# Patient Record
Sex: Female | Born: 1975
Health system: Southern US, Community
[De-identification: ages and names within clinical notes are randomized; demographics above are authoritative.]

## PROBLEM LIST (undated history)

## (undated) DIAGNOSIS — N870 Mild cervical dysplasia: Secondary | ICD-10-CM

## (undated) DIAGNOSIS — K7689 Other specified diseases of liver: Secondary | ICD-10-CM

## (undated) DIAGNOSIS — N760 Acute vaginitis: Secondary | ICD-10-CM

## (undated) DIAGNOSIS — B9689 Other specified bacterial agents as the cause of diseases classified elsewhere: Secondary | ICD-10-CM

## (undated) DIAGNOSIS — C801 Malignant (primary) neoplasm, unspecified: Secondary | ICD-10-CM

## (undated) HISTORY — DX: Mild cervical dysplasia: N87.0

## (undated) HISTORY — DX: Other specified bacterial agents as the cause of diseases classified elsewhere: B96.89

## (undated) HISTORY — DX: Acute vaginitis: N76.0

## (undated) HISTORY — DX: Other specified diseases of liver: K76.89

## (undated) HISTORY — PX: WISDOM TOOTH EXTRACTION: SHX21

## (undated) HISTORY — DX: Other specified bacterial agents as the cause of diseases classified elsewhere: N76.0

---

## 2000-12-28 ENCOUNTER — Other Ambulatory Visit: Admission: RE | Admit: 2000-12-28 | Discharge: 2000-12-28 | Payer: Self-pay | Admitting: Obstetrics and Gynecology

## 2002-01-06 ENCOUNTER — Other Ambulatory Visit: Admission: RE | Admit: 2002-01-06 | Discharge: 2002-01-06 | Payer: Self-pay | Admitting: Obstetrics and Gynecology

## 2002-03-27 ENCOUNTER — Emergency Department (HOSPITAL_COMMUNITY): Admission: EM | Admit: 2002-03-27 | Discharge: 2002-03-27 | Payer: Self-pay | Admitting: Emergency Medicine

## 2002-12-07 ENCOUNTER — Other Ambulatory Visit: Admission: RE | Admit: 2002-12-07 | Discharge: 2002-12-07 | Payer: Self-pay | Admitting: Obstetrics and Gynecology

## 2004-01-08 ENCOUNTER — Other Ambulatory Visit: Admission: RE | Admit: 2004-01-08 | Discharge: 2004-01-08 | Payer: Self-pay | Admitting: Obstetrics and Gynecology

## 2005-01-28 ENCOUNTER — Other Ambulatory Visit: Admission: RE | Admit: 2005-01-28 | Discharge: 2005-01-28 | Payer: Self-pay | Admitting: Obstetrics and Gynecology

## 2005-07-14 HISTORY — PX: LIVER BIOPSY: SHX301

## 2006-01-29 ENCOUNTER — Other Ambulatory Visit: Admission: RE | Admit: 2006-01-29 | Discharge: 2006-01-29 | Payer: Self-pay | Admitting: Obstetrics and Gynecology

## 2008-07-14 DIAGNOSIS — K7689 Other specified diseases of liver: Secondary | ICD-10-CM

## 2008-07-14 HISTORY — DX: Other specified diseases of liver: K76.89

## 2010-01-10 ENCOUNTER — Encounter: Admission: RE | Admit: 2010-01-10 | Discharge: 2010-01-10 | Payer: Self-pay | Admitting: Family Medicine

## 2010-01-15 ENCOUNTER — Encounter: Admission: RE | Admit: 2010-01-15 | Discharge: 2010-01-15 | Payer: Self-pay | Admitting: Gastroenterology

## 2010-01-22 ENCOUNTER — Ambulatory Visit (HOSPITAL_COMMUNITY): Admission: RE | Admit: 2010-01-22 | Discharge: 2010-01-22 | Payer: Self-pay | Admitting: Gastroenterology

## 2010-01-28 ENCOUNTER — Encounter: Admission: RE | Admit: 2010-01-28 | Discharge: 2010-01-28 | Payer: Self-pay | Admitting: Gastroenterology

## 2010-02-05 ENCOUNTER — Ambulatory Visit (HOSPITAL_COMMUNITY): Admission: RE | Admit: 2010-02-05 | Discharge: 2010-02-05 | Payer: Self-pay | Admitting: Gastroenterology

## 2010-04-16 HISTORY — PX: LEEP: SHX91

## 2010-09-28 LAB — SAMPLE TO BLOOD BANK

## 2010-09-29 LAB — CBC
HCT: 39.1 % (ref 36.0–46.0)
Hemoglobin: 13.7 g/dL (ref 12.0–15.0)
MCH: 32.3 pg (ref 26.0–34.0)

## 2010-09-29 LAB — APTT: aPTT: 26 seconds (ref 24–37)

## 2010-09-29 LAB — PROTIME-INR: Prothrombin Time: 11.5 seconds — ABNORMAL LOW (ref 11.6–15.2)

## 2012-04-12 ENCOUNTER — Encounter: Payer: Self-pay | Admitting: Obstetrics and Gynecology

## 2012-04-20 ENCOUNTER — Encounter: Payer: Self-pay | Admitting: Obstetrics and Gynecology

## 2012-04-20 ENCOUNTER — Ambulatory Visit (INDEPENDENT_AMBULATORY_CARE_PROVIDER_SITE_OTHER): Payer: 59 | Admitting: Obstetrics and Gynecology

## 2012-04-20 VITALS — BP 110/60 | Ht 63.0 in | Wt 123.0 lb

## 2012-04-20 DIAGNOSIS — A630 Anogenital (venereal) warts: Secondary | ICD-10-CM

## 2012-04-20 DIAGNOSIS — Z139 Encounter for screening, unspecified: Secondary | ICD-10-CM

## 2012-04-20 NOTE — Progress Notes (Signed)
Here for STD testing and to ask questions about herpes sec to ex BF recently dx'd  Filed Vitals:   04/20/12 1445  BP: 110/60   ROS: noncontributory  Pelvic exam:  VULVA: normal appearing vulva with no masses, tenderness or lesions,  VAGINA: normal appearing vagina with normal color and discharge, no lesions, left labia with warts about 1cm looks like coalescence of 3 CERVIX: normal appearing cervix without discharge or lesions,  UTERUS: uterus is normal size, shape, consistency and nontender,  ADNEXA: normal adnexa in size, nontender and no masses.  A/P STD w/u with consent Questions answered PT wants warts removed, will do at visit next wk and likely r/s AEX

## 2012-04-21 LAB — RPR

## 2012-04-21 LAB — GC/CHLAMYDIA PROBE AMP, GENITAL: GC Probe Amp, Genital: NEGATIVE

## 2012-04-21 LAB — HSV 2 ANTIBODY, IGG: HSV 2 Glycoprotein G Ab, IgG: <0.1 IV

## 2012-04-21 LAB — HSV 1 ANTIBODY, IGG: HSV 1 Glycoprotein G Ab, IgG: 1.83 IV — ABNORMAL HIGH

## 2012-04-29 ENCOUNTER — Ambulatory Visit: Payer: 59

## 2012-04-29 ENCOUNTER — Encounter: Payer: Self-pay | Admitting: Obstetrics and Gynecology

## 2012-04-29 ENCOUNTER — Ambulatory Visit (INDEPENDENT_AMBULATORY_CARE_PROVIDER_SITE_OTHER): Payer: 59 | Admitting: Obstetrics and Gynecology

## 2012-04-29 VITALS — BP 100/64 | Ht 63.0 in | Wt 125.0 lb

## 2012-04-29 DIAGNOSIS — B079 Viral wart, unspecified: Secondary | ICD-10-CM

## 2012-04-29 DIAGNOSIS — Z124 Encounter for screening for malignant neoplasm of cervix: Secondary | ICD-10-CM

## 2012-04-29 NOTE — Progress Notes (Addendum)
Contraception None Last pap 10/30/2010 WNL Last Mammo None Last Colonoscopy None Last Dexa Scan None Primary MD [facility] Abuse at Home None  Pt here for wart removal Filed Vitals:   04/29/12 0943  BP: 100/64   Left labial warts identified adjacent to clitoris Area injected with approx 5cc on 1% lidocaine Lesion excised after prep with betadine, about 1.5cm  A/P Specimen sent to path - h/o VIN 2, neg deep margin, small area pos on lat margin Next available for AEX Reviewed labs from last visit

## 2012-05-03 ENCOUNTER — Telehealth: Payer: Self-pay

## 2012-05-03 NOTE — Telephone Encounter (Signed)
Pt was called and given STD results done on 04/20/2012,+ HSV 1. Mathis Bud

## 2012-05-04 LAB — PATHOLOGY

## 2012-06-08 ENCOUNTER — Encounter: Payer: Self-pay | Admitting: Obstetrics and Gynecology

## 2012-06-08 ENCOUNTER — Ambulatory Visit (INDEPENDENT_AMBULATORY_CARE_PROVIDER_SITE_OTHER): Payer: 59 | Admitting: Obstetrics and Gynecology

## 2012-06-08 DIAGNOSIS — Z124 Encounter for screening for malignant neoplasm of cervix: Secondary | ICD-10-CM

## 2012-06-08 NOTE — Progress Notes (Signed)
  Pt left without being seen this day. She will r/s. Melody Comas A

## 2012-08-23 DIAGNOSIS — B9689 Other specified bacterial agents as the cause of diseases classified elsewhere: Secondary | ICD-10-CM | POA: Insufficient documentation

## 2012-08-23 DIAGNOSIS — N870 Mild cervical dysplasia: Secondary | ICD-10-CM | POA: Insufficient documentation

## 2012-08-25 ENCOUNTER — Encounter: Payer: Self-pay | Admitting: Obstetrics and Gynecology

## 2012-08-25 ENCOUNTER — Ambulatory Visit: Payer: BC Managed Care – PPO | Admitting: Obstetrics and Gynecology

## 2012-08-25 VITALS — BP 102/68 | Resp 18 | Ht 63.0 in | Wt 119.0 lb

## 2012-08-25 DIAGNOSIS — Z124 Encounter for screening for malignant neoplasm of cervix: Secondary | ICD-10-CM

## 2012-08-25 DIAGNOSIS — N901 Moderate vulvar dysplasia: Secondary | ICD-10-CM

## 2012-08-25 MED ORDER — IMIQUIMOD 5 % EX CREA: TOPICAL_CREAM | CUTANEOUS | Status: DC

## 2012-08-25 NOTE — Progress Notes (Signed)
Patient ID: Katrina Lewis, female   DOB: 09/28/1975, 37 y.o.   MRN: [id number] Contraception none Last pap 10/2010 wnl Last Mammo never Last Colonoscopy never Last Dexa Scan 2 yrs ago/ here/ Osteopenia Primary [facility] Abuse at Home none  No complaints except a lot of pain healing the last time she had lesion removed.  Filed Vitals:   08/25/12 1025  BP: 102/68  Resp: 18   ROS: noncontributory  Physical Examination: General appearance - alert, well appearing, and in no distress Neck - supple, no significant adenopathy Chest - clear to auscultation, no wheezes, rales or rhonchi, symmetric air entry Heart - normal rate and regular rhythm Abdomen - soft, nontender, nondistended, no masses or organomegaly Breasts - breasts appear normal, no suspicious masses, no skin or nipple changes or axillary nodes Pelvic - normal external genitalia, vulva, vagina, cervix, uterus and adnexa, left labia with recurring lesion at area of prior resection, nt Back exam - no CVAT Extremities - no edema, redness or tenderness in the calves or thighs  Path - Skin, excision, left labia:  High grade squamous intraepithelial lesion / Vulvar intraepithelial neoplasia Grade 2 to 3 extending to lateral margin  A/P Path reviewed  - I rec further resection Pt wants to sched in Spring and in the meantime do aldara - she will call to schedule - sched for in proc room no overbooking Pap today Rx aldara

## 2012-08-26 LAB — PAP IG W/ RFLX HPV ASCU

## 2012-08-27 NOTE — Progress Notes (Signed)
Recall entered in Epic for pt to call and schedule minor procedure in June. I left Asher Muir  A message as wll, so she can make sure recall gets transferred from Epic to [place].Marga Hoots, Gerre Scull

## 2013-01-13 ENCOUNTER — Other Ambulatory Visit: Payer: Self-pay | Admitting: Obstetrics and Gynecology

## 2013-02-16 ENCOUNTER — Other Ambulatory Visit: Payer: Self-pay | Admitting: Obstetrics and Gynecology

## 2013-02-16 NOTE — H&P (Signed)
  Katrina Lewis is a 37 y.o.  female,  G0 presents for wide excision of left labia because of VIN-III/CIS.  Patient has a long history of vulvar dysplasia with a diagnosis of VIN-II in 2011 and in October 2013 had a lesion removed from the same area that returned VIN II-III.  At her follow up in February 2014, she was found to have a recurrence of her left labial lesion and was advised to have it removed.  The patient reported severe pain during the healing of her previous excision so she chose to delay another procedure.  During the interim she was given Aldara to use on her lesion.  On January 13, 2013 the patient underwent another excision of her left vulvar  and pathology report showed  High Grade VIN/VIN III/CIS.  Given these findings and the recurrent nature of her vulvar lesion,  the patient has consented to  a wide excision of her left labia.   Past Medical History  OB History: G0P0   GYN History: menarche: 37 YO;  LMP: 01/24/2013  Contracepton no method  The patient reports a past history of: HPV.   Last PAP smear: February 2014-normal  Medical History: Attention Deficit Disorder,  Autoimmune Hepatitis, osteopenis  Surgical History:  Loop Electrosurgical Excision Procedure Denies problems with anesthesia or history of blood transfusions  Family History: Diabetes mellitus, hypertension and elevated cholesterol  Social History: Married and employed in [employer];  Admits to daily cigarette smoking, occasional alcohol but denies illicit drug use.   Medication: Azothioprine 50 mg daily Amphetamine Salt Combo 10 mg daily  No Known Allergies  Denies sensitivity to peanuts, shellfish, soy, latex or adhesives.   ROS: Admits to glasses but  denies headache, vision changes, nasal congestion, dysphagia, tinnitus, dizziness, hoarseness, cough,  chest pain, shortness of breath, nausea, vomiting, diarrhea,constipation,  urinary frequency, urgency  dysuria, hematuria, vaginitis symptoms, pelvic pain,  swelling of joints,easy bruising,  myalgias, arthralgias, skin rashes, unexplained weight loss and except as is mentioned in the history of present illness, patient's review of systems is otherwise negative.    Physical Exam  Bp:   110/50  P: 65   R: 14  Temp. 99 degrees F orally   Weight: 121 lbs.  Height: 5'3"  BMI: 21.4  Neck: supple without masses or thyromegaly Lungs: clear to auscultation Heart: regular rate and rhythm Abdomen: soft, non-tender and no organomegaly Pelvic:EGBUS- wnl except left labia with 2 mm erosion on slight raised base, no drainage or tenderness,  vagina-normal rugae; uterus-normal size, cervix without lesions or motion tenderness; adnexae-no tenderness or masses Extremities:  no clubbing, cyanosis or edema   Assesment:  Vulvar Intraepithelial Neoplasia III/Carcinoma in Situ    Disposition:  A discussion was held with patient regarding the indication for her procedure(s) along with the risks, which include but are not limited to: reaction to anesthesia, damage to adjacent organs, infection and  excessive bleeding.  The patient verbalized understanding of these risks and has consented to proceed with Wide Excision of Left Labia at [facility] on February 28, 2013 at 9:30 a.m.   CSN# [id number]   Shyanna Klingel J. Lowell Guitar, PA-C  for Dr. Woodroe Mode.Su Hilt

## 2013-02-18 ENCOUNTER — Encounter (HOSPITAL_COMMUNITY): Payer: Self-pay | Admitting: *Deleted

## 2013-02-21 ENCOUNTER — Other Ambulatory Visit: Payer: Self-pay | Admitting: Obstetrics and Gynecology

## 2013-02-22 ENCOUNTER — Encounter (HOSPITAL_COMMUNITY): Payer: Self-pay | Admitting: Pharmacy Technician

## 2013-03-02 ENCOUNTER — Ambulatory Visit (HOSPITAL_COMMUNITY): Payer: BC Managed Care – PPO | Admitting: Anesthesiology

## 2013-03-02 ENCOUNTER — Encounter (HOSPITAL_COMMUNITY): Payer: Self-pay | Admitting: *Deleted

## 2013-03-02 ENCOUNTER — Ambulatory Visit (HOSPITAL_COMMUNITY)
Admission: RE | Admit: 2013-03-02 | Discharge: 2013-03-02 | Disposition: A | Discharge status: Home / Self Care | Payer: BC Managed Care – PPO | Source: Ambulatory Visit | Attending: Obstetrics and Gynecology | Admitting: Obstetrics and Gynecology

## 2013-03-02 ENCOUNTER — Encounter (HOSPITAL_COMMUNITY): Payer: Self-pay | Admitting: Anesthesiology

## 2013-03-02 ENCOUNTER — Encounter (HOSPITAL_COMMUNITY)
Admission: RE | Disposition: A | Discharge status: Home / Self Care | Payer: Self-pay | Source: Ambulatory Visit | Attending: Obstetrics and Gynecology

## 2013-03-02 DIAGNOSIS — D071 Carcinoma in situ of vulva: Secondary | ICD-10-CM | POA: Insufficient documentation

## 2013-03-02 HISTORY — PX: LABIOPLASTY: SHX1900

## 2013-03-02 LAB — CBC
HCT: 35.8 % — ABNORMAL LOW (ref 36.0–46.0)
Platelets: 375 10*3/uL (ref 150–400)
RBC: 3.26 MIL/uL — ABNORMAL LOW (ref 3.87–5.11)
RDW: 15.1 % (ref 11.5–15.5)
WBC: 6.5 10*3/uL (ref 4.0–10.5)

## 2013-03-02 LAB — HCG, SERUM, QUALITATIVE: Preg, Serum: NEGATIVE

## 2013-03-02 SURGERY — LABIAPLASTY, VULVA
Anesthesia: General | Site: Vagina | Laterality: Left | Wound class: Clean Contaminated

## 2013-03-02 MED ORDER — ONDANSETRON HCL 4 MG/2ML IJ SOLN: 4.0000 mg | Freq: Once | INTRAMUSCULAR | Status: DC | PRN

## 2013-03-02 MED ORDER — LACTATED RINGERS IV SOLN
INTRAVENOUS | Status: DC
  Administered 2013-03-02 (×3): via INTRAVENOUS

## 2013-03-02 MED ORDER — EPHEDRINE SULFATE 50 MG/ML IJ SOLN
INTRAMUSCULAR | Status: DC | PRN
  Administered 2013-03-02 (×4): 10 mg via INTRAVENOUS

## 2013-03-02 MED ORDER — MEPERIDINE HCL 25 MG/ML IJ SOLN: 6.2500 mg | INTRAMUSCULAR | Status: DC | PRN

## 2013-03-02 MED ORDER — MIDAZOLAM HCL 2 MG/2ML IJ SOLN
INTRAMUSCULAR | Status: AC
  Filled 2013-03-02: qty 2

## 2013-03-02 MED ORDER — LIDOCAINE HCL (CARDIAC) 20 MG/ML IV SOLN
INTRAVENOUS | Status: AC
  Filled 2013-03-02: qty 5

## 2013-03-02 MED ORDER — HYDROMORPHONE HCL 2 MG PO TABS
ORAL_TABLET | ORAL | Status: AC
  Administered 2013-03-02: 2 mg via ORAL
  Filled 2013-03-02: qty 1

## 2013-03-02 MED ORDER — IBUPROFEN 600 MG PO TABS: 600.0000 mg | ORAL_TABLET | Freq: Four times a day (QID) | ORAL | Status: DC | PRN

## 2013-03-02 MED ORDER — OXYTOCIN 10 UNIT/ML IJ SOLN
INTRAMUSCULAR | Status: AC
  Filled 2013-03-02: qty 1

## 2013-03-02 MED ORDER — LIDOCAINE HCL (CARDIAC) 20 MG/ML IV SOLN
INTRAVENOUS | Status: DC | PRN
  Administered 2013-03-02: 50 mg via INTRAVENOUS

## 2013-03-02 MED ORDER — KETOROLAC TROMETHAMINE 30 MG/ML IJ SOLN
INTRAMUSCULAR | Status: AC
  Filled 2013-03-02: qty 1

## 2013-03-02 MED ORDER — KETOROLAC TROMETHAMINE 30 MG/ML IJ SOLN: 15.0000 mg | Freq: Once | INTRAMUSCULAR | Status: DC | PRN

## 2013-03-02 MED ORDER — PROPOFOL 10 MG/ML IV BOLUS
INTRAVENOUS | Status: DC | PRN
  Administered 2013-03-02: 180 mg via INTRAVENOUS

## 2013-03-02 MED ORDER — ONDANSETRON HCL 4 MG/2ML IJ SOLN
INTRAMUSCULAR | Status: DC | PRN
  Administered 2013-03-02: 4 mg via INTRAVENOUS

## 2013-03-02 MED ORDER — FENTANYL CITRATE 0.05 MG/ML IJ SOLN: 25.0000 ug | INTRAMUSCULAR | Status: DC | PRN

## 2013-03-02 MED ORDER — BUPIVACAINE HCL (PF) 0.25 % IJ SOLN
INTRAMUSCULAR | Status: AC
  Filled 2013-03-02: qty 30

## 2013-03-02 MED ORDER — LIDOCAINE-EPINEPHRINE (PF) 1 %-1:200000 IJ SOLN
INTRAMUSCULAR | Status: AC
  Filled 2013-03-02: qty 10

## 2013-03-02 MED ORDER — KETOROLAC TROMETHAMINE 30 MG/ML IJ SOLN
INTRAMUSCULAR | Status: DC | PRN
  Administered 2013-03-02: 30 mg via INTRAVENOUS

## 2013-03-02 MED ORDER — MIDAZOLAM HCL 5 MG/5ML IJ SOLN
INTRAMUSCULAR | Status: DC | PRN
  Administered 2013-03-02: 2 mg via INTRAVENOUS

## 2013-03-02 MED ORDER — HYDROMORPHONE HCL 2 MG PO TABS: 2.0000 mg | ORAL_TABLET | Freq: Four times a day (QID) | ORAL | Status: DC | PRN

## 2013-03-02 MED ORDER — FENTANYL CITRATE 0.05 MG/ML IJ SOLN
INTRAMUSCULAR | Status: DC | PRN
  Administered 2013-03-02: 50 ug via INTRAVENOUS
  Administered 2013-03-02: 100 ug via INTRAVENOUS

## 2013-03-02 MED ORDER — FENTANYL CITRATE 0.05 MG/ML IJ SOLN
INTRAMUSCULAR | Status: AC
  Filled 2013-03-02: qty 5

## 2013-03-02 MED ORDER — DEXAMETHASONE SODIUM PHOSPHATE 10 MG/ML IJ SOLN
INTRAMUSCULAR | Status: AC
  Filled 2013-03-02: qty 1

## 2013-03-02 MED ORDER — LIDOCAINE-EPINEPHRINE (PF) 1 %-1:200000 IJ SOLN
INTRAMUSCULAR | Status: DC | PRN
  Administered 2013-03-02: 4 mL

## 2013-03-02 MED ORDER — PROPOFOL 10 MG/ML IV EMUL
INTRAVENOUS | Status: AC
  Filled 2013-03-02: qty 20

## 2013-03-02 MED ORDER — ONDANSETRON HCL 4 MG/2ML IJ SOLN
INTRAMUSCULAR | Status: AC
  Filled 2013-03-02: qty 2

## 2013-03-02 MED ORDER — DEXAMETHASONE SODIUM PHOSPHATE 10 MG/ML IJ SOLN
INTRAMUSCULAR | Status: DC | PRN
  Administered 2013-03-02: 10 mg via INTRAVENOUS

## 2013-03-02 MED ORDER — EPHEDRINE 5 MG/ML INJ
INTRAVENOUS | Status: AC
  Filled 2013-03-02: qty 10

## 2013-03-02 MED ORDER — BUPIVACAINE HCL 0.25 % IJ SOLN
INTRAMUSCULAR | Status: DC | PRN
  Administered 2013-03-02: 7 mL
  Administered 2013-03-02: 5 mL

## 2013-03-02 SURGICAL SUPPLY — 27 items
APL SKNCLS STERI-STRIP NONHPOA (GAUZE/BANDAGES/DRESSINGS) ×1
BENZOIN TINCTURE PRP APPL 2/3 (GAUZE/BANDAGES/DRESSINGS) ×1 IMPLANT
BLADE SURG 15 STRL LF C SS BP (BLADE) ×1 IMPLANT
BLADE SURG 15 STRL SS (BLADE) ×4
CLOTH BEACON ORANGE TIMEOUT ST (SAFETY) ×2 IMPLANT
COUNTER NEEDLE 1200 MAGNETIC (NEEDLE) IMPLANT
ELECT REM PT RETURN 9FT ADLT (ELECTROSURGICAL) ×2
ELECTRODE REM PT RTRN 9FT ADLT (ELECTROSURGICAL) ×1 IMPLANT
GLOVE BIO SURGEON STRL SZ7.5 (GLOVE) ×2 IMPLANT
GLOVE BIOGEL PI IND STRL 7.5 (GLOVE) ×2 IMPLANT
GLOVE BIOGEL PI INDICATOR 7.5 (GLOVE) ×2
GOWN STRL REIN XL XLG (GOWN DISPOSABLE) ×4 IMPLANT
PACK VAGINAL MINOR WOMEN LF (CUSTOM PROCEDURE TRAY) ×2 IMPLANT
PAD OB MATERNITY 4.3X12.25 (PERSONAL CARE ITEMS) ×2 IMPLANT
PENCIL BUTTON HOLSTER BLD 10FT (ELECTRODE) ×1 IMPLANT
STRIP CLOSURE SKIN 1/4X4 (GAUZE/BANDAGES/DRESSINGS) ×2 IMPLANT
SUT MON AB 4-0 PS1 27 (SUTURE) ×4 IMPLANT
SUT SILK 3 0 (SUTURE) ×2
SUT SILK 3-0 FS1 18XBRD (SUTURE) IMPLANT
SUT VIC AB 3-0 SH 27 (SUTURE) ×2
SUT VIC AB 3-0 SH 27XBRD (SUTURE) IMPLANT
SUT VIC AB 4-0 SH 27 (SUTURE) ×2
SUT VIC AB 4-0 SH 27XANBCTRL (SUTURE) IMPLANT
TOWEL OR 17X24 6PK STRL BLUE (TOWEL DISPOSABLE) ×4 IMPLANT
TUBING CONNECTING 10 (TUBING) ×1 IMPLANT
WATER STERILE IRR 1000ML POUR (IV SOLUTION) ×2 IMPLANT
YANKAUER SUCT BULB TIP NO VENT (SUCTIONS) ×2 IMPLANT

## 2013-03-02 NOTE — Op Note (Signed)
Preop Diagnosis: Carcinoma in situ of the Vulva   Postop Diagnosis: Carcinoma in situ of the Vulva   Procedure: Wide Local Excision of left labia  Anesthesia: General   Anesthesiologist: Brayton Caves, MD   Attending: Purcell Nails, MD   Assistant: N/a  Findings: N/a  Pathology: approx 2-3cm excised skin tagged with 1 black silk suture at 3:00 and 2 purple vicryl sutures at 9:00  Fluids: See Flowsheet  UOP: QS via straight cath prior to procedure  EBL: Minimal less than 50cc  Complications: None  Procedure: The patient was taken to the operating room after risks, benefits and alternatives were discussed with the patient and consent signed and witnessed.  A time out was performed per protocol.  The patient was prepped and draped in the normal sterile fashion in the dorsal lithotomy position.  The affected area was marked with a marking pen and the area excised after injecting 1% lidocaine with epi at a ratio of 1:200.  The underlying tissue was reapproximated in layers using 4-0 monocryl.  The skin was reapproximated using 3-0 vicryl and injected with 0.25% marcaine and 1% lidocaine with epi.  The specimen was tagged as mentioned above.  Steristrips were applied with benzoin.  Sponge, lap and needle counts were correct.  The patient tolerated the procedure well and was returned to the recovery room in good condition.

## 2013-03-02 NOTE — Interval H&P Note (Signed)
History and Physical Interval Note:  03/02/2013 9:58 AM  Katrina Lewis  has presented today for surgery, with the diagnosis of Carcinoma in situ of the Vulva  The various methods of treatment have been discussed with the patient and family. After consideration of risks, benefits and other options for treatment, the patient has consented to  Procedure(s) with comments: LABIAPLASTY (Left) - Wide Local Excision of Left Labia as a surgical intervention .  The patient's history has been reviewed, patient examined, no change in status, stable for surgery.  I have reviewed the patient's chart and labs.  Questions were answered to the patient's satisfaction.     Purcell Nails

## 2013-03-02 NOTE — Anesthesia Postprocedure Evaluation (Signed)
  Anesthesia Post Note  Patient: Katrina Lewis  Procedure(s) Performed: Procedure(s) (LRB): LABIAPLASTY (Left)  Anesthesia type: GA  Patient location: PACU  Post pain: Pain level controlled  Post assessment: Post-op Vital signs reviewed  Last Vitals:  Filed Vitals:   03/02/13 1215  BP: 98/54  Pulse: 58  Temp:   Resp: 13    Post vital signs: Reviewed  Level of consciousness: sedated  Complications: No apparent anesthesia complications

## 2013-03-02 NOTE — Transfer of Care (Signed)
Immediate Anesthesia Transfer of Care Note  Patient: Katrina Lewis  Procedure(s) Performed: Procedure(s) with comments: LABIAPLASTY (Left) - Wide Local Excision of Left Labia  Patient Location: PACU  Anesthesia Type:General  Level of Consciousness: awake  Airway & Oxygen Therapy: Patient Spontanous Breathing  Post-op Assessment: Report given to PACU RN  Post vital signs: stable  Filed Vitals:   03/02/13 0843  BP: 121/67  Pulse: 95  Temp: 36.7 C  Resp: 16    Complications: No apparent anesthesia complications

## 2013-03-02 NOTE — H&P (View-Only) (Signed)
  Katrina Lewis is a 37 y.o.  female,  G0 presents for wide excision of left labia because of VIN-III/CIS.  Patient has a long history of vulvar dysplasia with a diagnosis of VIN-II in 2011 and in October 2013 had a lesion removed from the same area that returned VIN II-III.  At her follow up in February 2014, she was found to have a recurrence of her left labial lesion and was advised to have it removed.  The patient reported severe pain during the healing of her previous excision so she chose to delay another procedure.  During the interim she was given Aldara to use on her lesion.  On January 13, 2013 the patient underwent another excision of her left vulvar  and pathology report showed  High Grade VIN/VIN III/CIS.  Given these findings and the recurrent nature of her vulvar lesion,  the patient has consented to  a wide excision of her left labia.   Past Medical History  OB History: G0P0   GYN History: menarche: 37 YO;  LMP: 01/24/2013  Contracepton no method  The patient reports a past history of: HPV.   Last PAP smear: February 2014-normal  Medical History: Attention Deficit Disorder,  Autoimmune Hepatitis, osteopenis  Surgical History:  Loop Electrosurgical Excision Procedure Denies problems with anesthesia or history of blood transfusions  Family History: Diabetes mellitus, hypertension and elevated cholesterol  Social History: Married and employed in sales;  Admits to daily cigarette smoking, occasional alcohol but denies illicit drug use.   Medication: Azothioprine 50 mg daily Amphetamine Salt Combo 10 mg daily  No Known Allergies  Denies sensitivity to peanuts, shellfish, soy, latex or adhesives.   ROS: Admits to glasses but  denies headache, vision changes, nasal congestion, dysphagia, tinnitus, dizziness, hoarseness, cough,  chest pain, shortness of breath, nausea, vomiting, diarrhea,constipation,  urinary frequency, urgency  dysuria, hematuria, vaginitis symptoms, pelvic pain,  swelling of joints,easy bruising,  myalgias, arthralgias, skin rashes, unexplained weight loss and except as is mentioned in the history of present illness, patient's review of systems is otherwise negative.    Physical Exam  Bp:   110/50  P: 65   R: 14  Temp. 99 degrees F orally   Weight: 121 lbs.  Height: 5'3"  BMI: 21.4  Neck: supple without masses or thyromegaly Lungs: clear to auscultation Heart: regular rate and rhythm Abdomen: soft, non-tender and no organomegaly Pelvic:EGBUS- wnl except left labia with 2 mm erosion on slight raised base, no drainage or tenderness,  vagina-normal rugae; uterus-normal size, cervix without lesions or motion tenderness; adnexae-no tenderness or masses Extremities:  no clubbing, cyanosis or edema   Assesment:  Vulvar Intraepithelial Neoplasia III/Carcinoma in Situ    Disposition:  A discussion was held with patient regarding the indication for her procedure(s) along with the risks, which include but are not limited to: reaction to anesthesia, damage to adjacent organs, infection and  excessive bleeding.  The patient verbalized understanding of these risks and has consented to proceed with Wide Excision of Left Labia at [facility] on February 28, 2013 at 9:30 a.m.   CSN# [id number]   Gottfried Standish J. Judd Mccubbin, PA-C  for Dr. Angela Y.Roberts   

## 2013-03-02 NOTE — Anesthesia Preprocedure Evaluation (Signed)
Anesthesia Evaluation  Patient identified by MRN, date of birth, ID band Patient awake    Reviewed: Allergy & Precautions, H&P , NPO status , Patient's Chart, lab work & pertinent test results  Airway Mallampati: I TM Distance: >3 FB Neck ROM: full    Dental no notable dental hx. (+) Teeth Intact   Pulmonary Current Smoker,    Pulmonary exam normal       Cardiovascular negative cardio ROS      Neuro/Psych negative neurological ROS  negative psych ROS   GI/Hepatic negative GI ROS, Neg liver ROS,   Endo/Other  negative endocrine ROS  Renal/GU negative Renal ROS  Female GU complaint     Musculoskeletal negative musculoskeletal ROS (+)   Abdominal Normal abdominal exam  (+)   Peds  Hematology negative hematology ROS (+)   Anesthesia Other Findings   Reproductive/Obstetrics negative OB ROS                           Anesthesia Physical Anesthesia Plan  ASA: II  Anesthesia Plan: General   Post-op Pain Management:    Induction: Intravenous  Airway Management Planned: LMA  Additional Equipment:   Intra-op Plan:   Post-operative Plan:   Informed Consent: I have reviewed the patients History and Physical, chart, labs and discussed the procedure including the risks, benefits and alternatives for the proposed anesthesia with the patient or authorized representative who has indicated his/her understanding and acceptance.     Plan Discussed with: CRNA and Surgeon  Anesthesia Plan Comments:         Anesthesia Quick Evaluation

## 2013-03-03 ENCOUNTER — Encounter (HOSPITAL_COMMUNITY): Payer: Self-pay | Admitting: Obstetrics and Gynecology

## 2019-08-18 ENCOUNTER — Emergency Department (HOSPITAL_COMMUNITY)
Admission: EM | Admit: 2019-08-18 | Discharge: 2019-08-18 | Disposition: A | Discharge status: Home / Self Care | Payer: 59 | Source: Home / Self Care | Attending: Emergency Medicine | Admitting: Emergency Medicine

## 2019-08-18 ENCOUNTER — Encounter (HOSPITAL_COMMUNITY): Payer: Self-pay

## 2019-08-18 ENCOUNTER — Emergency Department (HOSPITAL_COMMUNITY): Payer: 59

## 2019-08-18 ENCOUNTER — Ambulatory Visit (HOSPITAL_COMMUNITY): Payer: 59

## 2019-08-18 ENCOUNTER — Other Ambulatory Visit: Payer: Self-pay

## 2019-08-18 DIAGNOSIS — R1013 Epigastric pain: Secondary | ICD-10-CM | POA: Diagnosis not present

## 2019-08-18 DIAGNOSIS — R1011 Right upper quadrant pain: Secondary | ICD-10-CM | POA: Insufficient documentation

## 2019-08-18 DIAGNOSIS — Z79899 Other long term (current) drug therapy: Secondary | ICD-10-CM | POA: Insufficient documentation

## 2019-08-18 DIAGNOSIS — F17210 Nicotine dependence, cigarettes, uncomplicated: Secondary | ICD-10-CM | POA: Insufficient documentation

## 2019-08-18 DIAGNOSIS — N10 Acute tubulo-interstitial nephritis: Secondary | ICD-10-CM | POA: Diagnosis not present

## 2019-08-18 LAB — URINALYSIS, ROUTINE W REFLEX MICROSCOPIC
Bilirubin Urine: NEGATIVE
Glucose, UA: NEGATIVE mg/dL
Hgb urine dipstick: NEGATIVE
Ketones, ur: NEGATIVE mg/dL
Leukocytes,Ua: NEGATIVE
Nitrite: NEGATIVE
Protein, ur: NEGATIVE mg/dL
Specific Gravity, Urine: 1.011 (ref 1.005–1.030)
pH: 7 (ref 5.0–8.0)

## 2019-08-18 LAB — CBC
HCT: 38.2 % (ref 36.0–46.0)
Hemoglobin: 12.7 g/dL (ref 12.0–15.0)
MCH: 31.2 pg (ref 26.0–34.0)
MCHC: 33.2 g/dL (ref 30.0–36.0)
MCV: 93.9 fL (ref 80.0–100.0)
Platelets: 356 10*3/uL (ref 150–400)
RBC: 4.07 MIL/uL (ref 3.87–5.11)
RDW: 13.3 % (ref 11.5–15.5)
WBC: 11.2 10*3/uL — ABNORMAL HIGH (ref 4.0–10.5)
nRBC: 0 % (ref 0.0–0.2)

## 2019-08-18 LAB — COMPREHENSIVE METABOLIC PANEL
ALT: 280 U/L — ABNORMAL HIGH (ref 0–44)
AST: 333 U/L — ABNORMAL HIGH (ref 15–41)
Albumin: 3.9 g/dL (ref 3.5–5.0)
Alkaline Phosphatase: 293 U/L — ABNORMAL HIGH (ref 38–126)
Anion gap: 9 (ref 5–15)
BUN: 8 mg/dL (ref 6–20)
CO2: 27 mmol/L (ref 22–32)
Calcium: 9.3 mg/dL (ref 8.9–10.3)
Chloride: 102 mmol/L (ref 98–111)
Creatinine, Ser: 0.78 mg/dL (ref 0.44–1.00)
GFR calc Af Amer: >60 mL/min (ref >60)
GFR calc non Af Amer: >60 mL/min (ref >60)
Glucose, Bld: 100 mg/dL — ABNORMAL HIGH (ref 70–99)
Potassium: 4.1 mmol/L (ref 3.5–5.1)
Sodium: 138 mmol/L (ref 135–145)
Total Bilirubin: 1.2 mg/dL (ref 0.3–1.2)
Total Protein: 6.5 g/dL (ref 6.5–8.1)

## 2019-08-18 LAB — I-STAT BETA HCG BLOOD, ED (MC, WL, AP ONLY): I-stat hCG, quantitative: <5 m[IU]/mL (ref <5)

## 2019-08-18 LAB — LIPASE, BLOOD: Lipase: 16 U/L (ref 11–51)

## 2019-08-18 MED ORDER — ONDANSETRON HCL 4 MG/2ML IJ SOLN
4.0000 mg | Freq: Once | INTRAMUSCULAR | Status: AC
  Administered 2019-08-18: 4 mg via INTRAVENOUS
  Filled 2019-08-18: qty 2

## 2019-08-18 MED ORDER — FENTANYL CITRATE (PF) 100 MCG/2ML IJ SOLN
50.0000 ug | Freq: Once | INTRAMUSCULAR | Status: AC
  Administered 2019-08-18: 50 ug via INTRAVENOUS
  Filled 2019-08-18: qty 2

## 2019-08-18 MED ORDER — FENTANYL CITRATE (PF) 100 MCG/2ML IJ SOLN: 50.0000 ug | Freq: Once | INTRAMUSCULAR | Status: DC | PRN

## 2019-08-18 NOTE — ED Provider Notes (Signed)
Transfer of Care Note I assumed care of Katrina Lewis on 08/18/2019  Briefly, Tamyka Lubold is a 44 y.o. female who:  Ab pain  Autoimmune hep  [place] okay    The plan includes:  Fu CT scan   Please refer to the original provider's note for additional information regarding the care of [employer].  ### Reassessment: I personally reassessed the patient:  Vital Signs:  The most current vitals were  Vitals:   08/18/19 1230 08/18/19 1245  BP:  130/81  Pulse: 77 73  Resp: 11 (!) 9  Temp:    SpO2: 100% 100%    Hemodynamics:  The patient is hemodynamically stable. Mental Status:  The patient is Alert  Additional MDM: On my reassessment, vital signs stable, all labs and imaging are reviewed.  Patient states her pain has improved markedly, my exam is benign, soft, nontender, no focality noted.  Patient states she would like to go home, and states that she would like to follow-up with her PCP regarding outpatient imaging, she does not want the CT scan here.  I think this is reasonable, given her benign exam, reassuring labs as well as continued improvement in the ED.  Patient is tolerating p.o. well, states that if she becomes worse she will return to the ED, gave her very strict return precautions.  She agreed and understood, discharged home in good condition.  The attending physician was present and available for all medical decision making and procedures related to this patient's care.    Kizzie Fantasia, MD 08/18/19 1730    Rex Kras, Wenda Overland, MD 08/22/19 [phone]

## 2019-08-18 NOTE — ED Triage Notes (Signed)
Pt reports upper abd pain, bloating feeling but she took pepto bismol without relief. Nausea but no vomiting or diarrhea.   Went to UC and had blood work and xrays done, was told it was gas.

## 2019-08-18 NOTE — ED Provider Notes (Signed)
[facility] EMERGENCY DEPARTMENT Provider Note   CSN: [id number] Arrival date & time: 08/18/19  1050     History Chief Complaint  Patient presents with  . Abdominal Pain    Katrina Lewis is a 44 y.o. female.  Who has a past medical history of autoimmune hepatitis.  Patient states that she has been in remission has not seen a hepatologist in 7 years is not on any immunosuppressive medications.  Presents emergency department with chief complaint of abdominal pain.  She states that she awoke from sleep at 330 this morning with pain in her epigastrium.  She states that it felt like gas.  She took Phazyme and within an hour had no relief so she "took a swig" of Pepto-Bismol.  Patient states that she got to work but her pain became worse.  She was unable to tolerate the pain.  She rates the pain as severe, epigastric, radiating to the right upper quadrant.  Worse when she breathes in and out.  She has not had any nausea.  Pain does not radiate to her back or shoulder.  She has no history of gallbladder issues.  She denies any recent alcohol or NSAID use.  HPI     Past Medical History:  Diagnosis Date  . 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 Bacterial vaginosis   . CIN I (cervical intraepithelial neoplasia I)     Patient Active Problem List   Diagnosis Date Noted  . 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 Bacterial vaginosis   . CIN I (cervical intraepithelial neoplasia I)   . Condyloma 04/20/2012    Past Surgical History:  Procedure Laterality Date  . LABIOPLASTY Left 03/02/2013   Procedure: LABIAPLASTY;  Surgeon: Delice Lesch, MD;  Location: [facility];  Service: Gynecology;  Laterality: Left;  Wide Local Excision of Left Labia  . LEEP  04/16/2010  . LIVER BIOPSY  2007  . WISDOM TOOTH EXTRACTION       OB History    Gravida  0   Para       Term      Preterm      AB      Living        SAB      TAB      Ectopic      Multiple      Live Births              Family History  Problem Relation Age of Onset  . Diabetes Father   . Hypertension Father   . Hypertension Mother     Social History   Tobacco Use  . Smoking status: Current Every Day Smoker    Packs/day: 1.00    Years: 20.00    Pack years: 20.00    Types: Cigarettes  . Smokeless tobacco: Never Used  Substance Use Topics  . Alcohol use: Yes    Comment: socially  . Drug use: No    Home Medications Prior to Admission medications   Medication Sig Start Date End Date Taking? Authorizing Provider  amphetamine-dextroamphetamine (ADDERALL) 20 MG tablet Take 20 mg by mouth 2 (two) times daily.    [provider]  azaTHIOprine (IMURAN) 50 MG tablet Take 150 mg by mouth daily.    [provider]  folic acid (FOLVITE) 1 MG tablet Take 1 mg  by mouth daily.    [provider]  HYDROmorphone (DILAUDID) 2 MG tablet Take 1-2 tablets (2-4 mg total) by mouth every 6 (six) hours as needed for pain. 03/02/13   Everett Graff, MD  ibuprofen (ADVIL,MOTRIN) 600 MG tablet Take 1 tablet (600 mg total) by mouth every 6 (six) hours as needed for pain. 03/02/13   Everett Graff, MD    Allergies    Patient has no known allergies.  Review of Systems   Review of Systems Ten systems reviewed and are negative for acute change, except as noted in the HPI.   Physical Exam Updated Vital Signs BP (!) 123/106 (BP Location: Left Arm)   Pulse 85   Temp 99.4 F (37.4 C) (Oral)   Resp 16   SpO2 100%   Physical Exam Vitals and nursing note reviewed.  Constitutional:      General: She is not in acute distress.    Appearance: She is well-developed. She is not diaphoretic.  HENT:     Head: Normocephalic and atraumatic.  Eyes:     General: No scleral icterus.    Conjunctiva/sclera: Conjunctivae normal.  Cardiovascular:     Rate and Rhythm:  Normal rate and regular rhythm.     Heart sounds: Normal heart sounds. No murmur. No friction rub. No gallop.   Pulmonary:     Effort: Pulmonary effort is normal. No respiratory distress.     Breath sounds: Normal breath sounds.  Abdominal:     General: Bowel sounds are normal. There is no distension.     Palpations: Abdomen is soft. There is no mass.     Tenderness: There is abdominal tenderness in the right upper quadrant and epigastric area. There is guarding.  Musculoskeletal:     Cervical back: Normal range of motion.  Skin:    General: Skin is warm and dry.  Neurological:     Mental Status: She is alert and oriented to person, place, and time.  Psychiatric:        Behavior: Behavior normal.     ED Results / Procedures / Treatments   Labs (all labs ordered are listed, but only abnormal results are displayed) Labs Reviewed  LIPASE, BLOOD  COMPREHENSIVE METABOLIC PANEL  CBC  URINALYSIS, ROUTINE W REFLEX MICROSCOPIC  I-STAT BETA HCG BLOOD, ED ([facility], [facility], [facility] ONLY)    EKG None  Radiology No results found.  Procedures Procedures (including critical care time)  Medications Ordered in ED Medications - No data to display  ED Course  I have reviewed the triage vital signs and the nursing notes.  Pertinent labs & imaging results that were available during my care of the patient were reviewed by me and considered in my medical decision making (see chart for details).  Clinical Course as of Aug 17 1316  Thu Aug 18, 2019  1317 Potassium: 4.1 [AH]    Clinical Course User Index [AH] Margarita Mail, PA-C   MDM Rules/Calculators/A&P                      44 year old female here with acute right upper quadrant abdominal pain. The differential diagnosis for RUQ is, but not limited to:  Cholelithiasis, cholecystitis, cholangitis, choledocholithiasis, hepatitis, pancreatitis, RLL pneumonia, pyelonephritis, urinary calculi, abdominal/liver abscess, musculoskeletal pain, herpes  zoster.   Patient has a negative urinalysis, negative pregnancy, negative lipase.  CMP shows elevated AST, ALT and alkaline phosphatase of uncertain etiology at this time.  White blood cell count elevated at  11.  Ultrasound of the abdomen shows no acute abnormalities, no cholelithiasis or other abnormalities at this time.  I have ordered a CT abdomen and pelvis which is currently pending.   Final Clinical Impression(s) / ED Diagnoses Final diagnoses:  Abdominal pain, RUQ    Rx / DC Orders ED Discharge Orders    None       Margarita Mail, PA-C 08/18/19 1516    Tegeler, Gwenyth Allegra, MD 08/18/19 [phone]

## 2019-08-18 NOTE — ED Notes (Signed)
Patient verbalizes understanding of discharge instructions. Opportunity for questioning and answers were provided. Armband removed by staff, pt discharged from ED.  

## 2019-08-19 ENCOUNTER — Emergency Department (HOSPITAL_COMMUNITY): Payer: 59

## 2019-08-19 ENCOUNTER — Inpatient Hospital Stay (HOSPITAL_COMMUNITY)
Admission: EM | Admit: 2019-08-19 | Discharge: 2019-08-22 | DRG: 690 | Disposition: A | Discharge status: Home / Self Care | Payer: 59 | Attending: Student | Admitting: Student

## 2019-08-19 ENCOUNTER — Encounter (HOSPITAL_COMMUNITY): Payer: Self-pay

## 2019-08-19 DIAGNOSIS — Z20822 Contact with and (suspected) exposure to covid-19: Secondary | ICD-10-CM | POA: Diagnosis present

## 2019-08-19 DIAGNOSIS — Z833 Family history of diabetes mellitus: Secondary | ICD-10-CM

## 2019-08-19 DIAGNOSIS — Z8719 Personal history of other diseases of the digestive system: Secondary | ICD-10-CM | POA: Diagnosis not present

## 2019-08-19 DIAGNOSIS — Z87410 Personal history of cervical dysplasia: Secondary | ICD-10-CM

## 2019-08-19 DIAGNOSIS — K754 Autoimmune hepatitis: Secondary | ICD-10-CM | POA: Clinically undetermined

## 2019-08-19 DIAGNOSIS — F17210 Nicotine dependence, cigarettes, uncomplicated: Secondary | ICD-10-CM | POA: Diagnosis present

## 2019-08-19 DIAGNOSIS — Z79899 Other long term (current) drug therapy: Secondary | ICD-10-CM

## 2019-08-19 DIAGNOSIS — K838 Other specified diseases of biliary tract: Secondary | ICD-10-CM | POA: Diagnosis present

## 2019-08-19 DIAGNOSIS — Z720 Tobacco use: Secondary | ICD-10-CM | POA: Diagnosis not present

## 2019-08-19 DIAGNOSIS — K7689 Other specified diseases of liver: Secondary | ICD-10-CM | POA: Diagnosis present

## 2019-08-19 DIAGNOSIS — R1013 Epigastric pain: Secondary | ICD-10-CM | POA: Diagnosis present

## 2019-08-19 DIAGNOSIS — N2889 Other specified disorders of kidney and ureter: Secondary | ICD-10-CM | POA: Diagnosis present

## 2019-08-19 DIAGNOSIS — R399 Unspecified symptoms and signs involving the genitourinary system: Secondary | ICD-10-CM | POA: Diagnosis not present

## 2019-08-19 DIAGNOSIS — Z8249 Family history of ischemic heart disease and other diseases of the circulatory system: Secondary | ICD-10-CM

## 2019-08-19 DIAGNOSIS — R748 Abnormal levels of other serum enzymes: Secondary | ICD-10-CM | POA: Diagnosis not present

## 2019-08-19 DIAGNOSIS — K219 Gastro-esophageal reflux disease without esophagitis: Secondary | ICD-10-CM | POA: Diagnosis present

## 2019-08-19 DIAGNOSIS — R1011 Right upper quadrant pain: Secondary | ICD-10-CM | POA: Diagnosis not present

## 2019-08-19 DIAGNOSIS — N10 Acute tubulo-interstitial nephritis: Secondary | ICD-10-CM | POA: Diagnosis present

## 2019-08-19 DIAGNOSIS — R7401 Elevation of levels of liver transaminase levels: Secondary | ICD-10-CM

## 2019-08-19 LAB — CBC
HCT: 38 % (ref 36.0–46.0)
Hemoglobin: 12.6 g/dL (ref 12.0–15.0)
MCH: 31 pg (ref 26.0–34.0)
MCHC: 33.2 g/dL (ref 30.0–36.0)
MCV: 93.6 fL (ref 80.0–100.0)
Platelets: 369 10*3/uL (ref 150–400)
RBC: 4.06 MIL/uL (ref 3.87–5.11)
RDW: 13.4 % (ref 11.5–15.5)
WBC: 10.3 10*3/uL (ref 4.0–10.5)
nRBC: 0 % (ref 0.0–0.2)

## 2019-08-19 LAB — TSH: TSH: 2.621 u[IU]/mL (ref 0.350–4.500)

## 2019-08-19 LAB — C-REACTIVE PROTEIN: CRP: 0.6 mg/dL (ref <1.0)

## 2019-08-19 LAB — HEPATITIS PANEL, ACUTE
HCV Ab: NONREACTIVE
Hep A IgM: NONREACTIVE
Hep B C IgM: NONREACTIVE
Hepatitis B Surface Ag: NONREACTIVE

## 2019-08-19 LAB — I-STAT BETA HCG BLOOD, ED (MC, WL, AP ONLY): I-stat hCG, quantitative: <5 m[IU]/mL (ref <5)

## 2019-08-19 LAB — URINALYSIS, ROUTINE W REFLEX MICROSCOPIC
Glucose, UA: NEGATIVE mg/dL
Hgb urine dipstick: NEGATIVE
Ketones, ur: 20 mg/dL — AB
Leukocytes,Ua: NEGATIVE
Nitrite: NEGATIVE
Protein, ur: NEGATIVE mg/dL
Specific Gravity, Urine: 1.021 (ref 1.005–1.030)
pH: 6 (ref 5.0–8.0)

## 2019-08-19 LAB — PROTIME-INR
INR: 1 (ref 0.8–1.2)
Prothrombin Time: 12.9 seconds (ref 11.4–15.2)

## 2019-08-19 LAB — HIV ANTIBODY (ROUTINE TESTING W REFLEX): HIV Screen 4th Generation wRfx: NONREACTIVE

## 2019-08-19 LAB — COMPREHENSIVE METABOLIC PANEL
ALT: 514 U/L — ABNORMAL HIGH (ref 0–44)
AST: 417 U/L — ABNORMAL HIGH (ref 15–41)
Albumin: 3.8 g/dL (ref 3.5–5.0)
Alkaline Phosphatase: 355 U/L — ABNORMAL HIGH (ref 38–126)
Anion gap: 12 (ref 5–15)
BUN: 8 mg/dL (ref 6–20)
CO2: 27 mmol/L (ref 22–32)
Calcium: 9.3 mg/dL (ref 8.9–10.3)
Chloride: 101 mmol/L (ref 98–111)
Creatinine, Ser: 0.86 mg/dL (ref 0.44–1.00)
GFR calc Af Amer: >60 mL/min (ref >60)
GFR calc non Af Amer: >60 mL/min (ref >60)
Glucose, Bld: 100 mg/dL — ABNORMAL HIGH (ref 70–99)
Potassium: 3.7 mmol/L (ref 3.5–5.1)
Sodium: 140 mmol/L (ref 135–145)
Total Bilirubin: 3.7 mg/dL — ABNORMAL HIGH (ref 0.3–1.2)
Total Protein: 6.7 g/dL (ref 6.5–8.1)

## 2019-08-19 LAB — ACETAMINOPHEN LEVEL: Acetaminophen (Tylenol), Serum: <10 ug/mL — ABNORMAL LOW (ref 10–30)

## 2019-08-19 LAB — LIPASE, BLOOD: Lipase: 17 U/L (ref 11–51)

## 2019-08-19 LAB — SARS CORONAVIRUS 2 (TAT 6-24 HRS): SARS Coronavirus 2: NEGATIVE

## 2019-08-19 LAB — POC SARS CORONAVIRUS 2 AG -  ED: SARS Coronavirus 2 Ag: NEGATIVE

## 2019-08-19 LAB — SEDIMENTATION RATE: Sed Rate: 30 mm/hr — ABNORMAL HIGH (ref 0–22)

## 2019-08-19 MED ORDER — SODIUM CHLORIDE 0.9 % IV SOLN: INTRAVENOUS | Status: DC

## 2019-08-19 MED ORDER — ALUM & MAG HYDROXIDE-SIMETH 200-200-20 MG/5ML PO SUSP
30.0000 mL | Freq: Once | ORAL | Status: AC
  Administered 2019-08-19: 30 mL via ORAL
  Filled 2019-08-19: qty 30

## 2019-08-19 MED ORDER — ENOXAPARIN SODIUM 40 MG/0.4ML ~~LOC~~ SOLN
40.0000 mg | SUBCUTANEOUS | Status: DC
  Administered 2019-08-19: 40 mg via SUBCUTANEOUS
  Filled 2019-08-19 (×4): qty 0.4

## 2019-08-19 MED ORDER — SODIUM CHLORIDE 0.9% FLUSH: 3.0000 mL | Freq: Once | INTRAVENOUS | Status: DC

## 2019-08-19 MED ORDER — IOHEXOL 300 MG/ML  SOLN
100.0000 mL | Freq: Once | INTRAMUSCULAR | Status: AC | PRN
  Administered 2019-08-19: 100 mL via INTRAVENOUS

## 2019-08-19 MED ORDER — SODIUM CHLORIDE 0.9% FLUSH
3.0000 mL | Freq: Two times a day (BID) | INTRAVENOUS | Status: DC
  Administered 2019-08-19: 3 mL via INTRAVENOUS

## 2019-08-19 MED ORDER — GADOBUTROL 1 MMOL/ML IV SOLN
7.0000 mL | Freq: Once | INTRAVENOUS | Status: AC | PRN
  Administered 2019-08-19: 7 mL via INTRAVENOUS

## 2019-08-19 MED ORDER — LIDOCAINE VISCOUS HCL 2 % MT SOLN
15.0000 mL | Freq: Once | OROMUCOSAL | Status: AC
  Administered 2019-08-19: 15 mL via ORAL
  Filled 2019-08-19: qty 15

## 2019-08-19 MED ORDER — MORPHINE SULFATE (PF) 2 MG/ML IV SOLN
2.0000 mg | INTRAVENOUS | Status: DC | PRN
  Administered 2019-08-20 – 2019-08-21 (×4): 2 mg via INTRAVENOUS
  Filled 2019-08-19 (×4): qty 1

## 2019-08-19 MED ORDER — ALBUTEROL SULFATE (2.5 MG/3ML) 0.083% IN NEBU: 2.5000 mg | INHALATION_SOLUTION | Freq: Four times a day (QID) | RESPIRATORY_TRACT | Status: DC | PRN

## 2019-08-19 MED ORDER — ONDANSETRON HCL 4 MG PO TABS: 4.0000 mg | ORAL_TABLET | Freq: Four times a day (QID) | ORAL | Status: DC | PRN

## 2019-08-19 MED ORDER — ALUM & MAG HYDROXIDE-SIMETH 200-200-20 MG/5ML PO SUSP
30.0000 mL | Freq: Four times a day (QID) | ORAL | Status: DC | PRN
  Administered 2019-08-19 – 2019-08-22 (×2): 30 mL via ORAL
  Filled 2019-08-19 (×3): qty 30

## 2019-08-19 MED ORDER — ONDANSETRON HCL 4 MG/2ML IJ SOLN: 4.0000 mg | Freq: Four times a day (QID) | INTRAMUSCULAR | Status: DC | PRN

## 2019-08-19 NOTE — H&P (Signed)
History and Physical    [name] SEG:[id number] DOB: 1975-11-03 DOA: 08/19/2019  Referring MD/NP/PA: Addison Lank, MD PCP: System, Pcp Not In  Patient coming from: Home  Chief Complaint: Epigastric abdominal pain  I have personally briefly reviewed patient's old medical records in [place]   HPI: Katrina Lewis is a 44 y.o. female with medical history significant of autoimmune hepatitis previously followed at [facility].  Presents with complaints of epigastric abdominal pain.  Symptoms started yesterday morning with complaints of feeling bloated.  She took Gas-X and Pepto-Bismol without relief.  She was unable to stay at work long due to pain symptoms.  Reported associated symptoms of nausea and fatigue.  She went to urgent care at home but was sent to the emergency department.  She was evaluated with ultrasound that showed coarse benign calcification of the right lobe of the liver and a small cortical calcification versus angiomyolipoma of the right kidney thought to be of no significance.  After receiving pain medications she felt better and decided to go home.  However, patient was awoken this morning around 12 AM with severe pain and came back to the hospital.  Denies having any change in bowels, fever, chest pain, vomiting, shortness of breath, or cough.  She drinks alcohol maybe once every quarter, and not on a daily basis..  The only change to her medicines included switching her calcium supplement.  Previous diagnosis of autoimmune hepatitis she reports was related to her OCP use.  She has not followed up with [facility] in regards to this in over 3 years.    ED Course: Upon admission into the emergency department patient was noted to have stable vital signs.  Patient noticed to have scleral icterus.  Labs from 2/4 to 2/5 significant for WBC 11.2->10.3, lipase 16->17, alkaline phosphatase 293-> 355, AST 333-> 417, ALT 282-> 514, total bilirubin 1.2-> 3.7.  COVID-19 testing was negative.   Urinalysis was negative for any signs of infection.  CT scan of the abdomen consistent with a focal pyelonephritis involving the upper pole region of the right kidney with possible early abscess, mild intra/extrahepatic biliary dilatation possibly related to autoimmune liver disease, and mild inflammation involving the right hepatic lobe adjacent to the right kidney.  MRI of the abdomen was ordered.  TRH called to admit.  Review of Systems  Constitutional: Positive for malaise/fatigue. Negative for chills and fever.  HENT: Negative for ear discharge and nosebleeds.   Eyes: Negative for photophobia and pain.  Respiratory: Negative for cough and shortness of breath.   Cardiovascular: Negative for chest pain and leg swelling.  Gastrointestinal: Positive for abdominal pain and nausea. Negative for blood in stool, diarrhea and vomiting.  Genitourinary: Negative for dysuria and hematuria.  Musculoskeletal: Negative for joint pain and myalgias.  Skin: Negative for itching and rash.  Neurological: Negative for loss of consciousness and weakness.  Psychiatric/Behavioral: Negative for substance abuse. The patient does not have insomnia.     Past Medical History:  Diagnosis Date  . 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 Bacterial vaginosis   . CIN I (cervical intraepithelial neoplasia I)     Past Surgical History:  Procedure Laterality Date  . LABIOPLASTY Left 03/02/2013   Procedure: LABIAPLASTY;  Surgeon: Delice Lesch, MD;  Location: [facility];  Service: Gynecology;  Laterality: Left;  Wide Local Excision of Left Labia  . LEEP  04/16/2010  . LIVER BIOPSY  2007  . WISDOM TOOTH EXTRACTION  reports that she has been smoking cigarettes. She has a 20.00 pack-year smoking history. She has never used smokeless tobacco. She reports current alcohol use. She reports that she does not use drugs.  No Known Allergies  Family History  Problem Relation Age of Onset  . Diabetes  Father   . Hypertension Father   . Hypertension Mother     Prior to Admission medications   Medication Sig Start Date End Date Taking? Authorizing Provider  amphetamine-dextroamphetamine (ADDERALL) 20 MG tablet Take 20 mg by mouth 2 (two) times daily.   Yes [provider]  WELLBUTRIN XL 150 MG 24 hr tablet Take 150 mg by mouth daily. 08/01/19  Yes [provider]  HYDROmorphone (DILAUDID) 2 MG tablet Take 1-2 tablets (2-4 mg total) by mouth every 6 (six) hours as needed for pain. Patient not taking: Reported on 08/19/2019 03/02/13   Everett Graff, MD  ibuprofen (ADVIL,MOTRIN) 600 MG tablet Take 1 tablet (600 mg total) by mouth every 6 (six) hours as needed for pain. Patient not taking: Reported on 08/19/2019 03/02/13   Everett Graff, MD    Physical Exam:  Constitutional:   Middle-aged female who appears to be in no acute distress at this time. Vitals:   08/19/19 0213 08/19/19 0400 08/19/19 0645 08/19/19 0730  BP:  (!) 129/95 (!) 141/84 133/87  Pulse:  83 67 71  Resp:    18  Temp:      TempSrc:      SpO2:  97% 99% 99%  Weight: 59 kg     Height: '5\' 3"'  (1.6 m)      Eyes: PERRL, scleral icterus present ENMT: Mucous membranes are moist. Posterior pharynx clear of any exudate or lesions.  Neck: normal, supple, no masses, no thyromegaly Respiratory: clear to auscultation bilaterally, no wheezing, no crackles. Normal respiratory effort. No accessory muscle use.  Cardiovascular: Regular rate and rhythm, no murmurs / rubs / gallops. No extremity edema. 2+ pedal pulses. No carotid bruits.  Abdomen: Epigastric tenderness appreciated.  Patient has no CVA or suprapubic tenderness. Musculoskeletal: no clubbing / cyanosis. No joint deformity upper and lower extremities. Good ROM, no contractures. Normal muscle tone.  Skin: no rashes, lesions, ulcers. No induration Neurologic: CN 2-12 grossly intact. Sensation intact, DTR normal. Strength 5/5 in all 4.  Psychiatric: Normal  judgment and insight. Alert and oriented x 3. Normal mood.     Labs on Admission: I have personally reviewed following labs and imaging studies  CBC: Recent Labs  Lab 08/18/19 1118 08/19/19 0129  WBC 11.2* 10.3  HGB 12.7 12.6  HCT 38.2 38.0  MCV 93.9 93.6  PLT 356 161   Basic Metabolic Panel: Recent Labs  Lab 08/18/19 1118 08/19/19 0129  NA 138 140  K 4.1 3.7  CL 102 101  CO2 27 27  GLUCOSE 100* 100*  BUN 8 8  CREATININE 0.78 0.86  CALCIUM 9.3 9.3   GFR: Estimated Creatinine Clearance: 70.5 mL/min (by C-G formula based on SCr of 0.86 mg/dL). Liver Function Tests: Recent Labs  Lab 08/18/19 1118 08/19/19 0129  AST 333* 417*  ALT 280* 514*  ALKPHOS 293* 355*  BILITOT 1.2 3.7*  PROT 6.5 6.7  ALBUMIN 3.9 3.8   Recent Labs  Lab 08/18/19 1118 08/19/19 0129  LIPASE 16 17   No results for input(s): AMMONIA in the last 168 hours. Coagulation Profile: No results for input(s): INR, PROTIME in the last 168 hours. Cardiac Enzymes: No results for input(s): CKTOTAL, CKMB, CKMBINDEX,  TROPONINI in the last 168 hours. BNP (last 3 results) No results for input(s): PROBNP in the last 8760 hours. HbA1C: No results for input(s): HGBA1C in the last 72 hours. CBG: No results for input(s): GLUCAP in the last 168 hours. Lipid Profile: No results for input(s): CHOL, HDL, LDLCALC, TRIG, CHOLHDL, LDLDIRECT in the last 72 hours. Thyroid Function Tests: No results for input(s): TSH, T4TOTAL, FREET4, T3FREE, THYROIDAB in the last 72 hours. Anemia Panel: No results for input(s): VITAMINB12, FOLATE, FERRITIN, TIBC, IRON, RETICCTPCT in the last 72 hours. Urine analysis:    Component Value Date/Time   COLORURINE AMBER (A) 08/19/2019 0108   APPEARANCEUR CLOUDY (A) 08/19/2019 0108   LABSPEC 1.021 08/19/2019 0108   PHURINE 6.0 08/19/2019 0108   GLUCOSEU NEGATIVE 08/19/2019 0108   HGBUR NEGATIVE 08/19/2019 0108   BILIRUBINUR SMALL (A) 08/19/2019 0108   KETONESUR 20 (A) 08/19/2019  0108   PROTEINUR NEGATIVE 08/19/2019 0108   NITRITE NEGATIVE 08/19/2019 0108   LEUKOCYTESUR NEGATIVE 08/19/2019 0108   Sepsis Labs: No results found for this or any previous visit (from the past 240 hour(s)).   Radiological Exams on Admission: CT ABDOMEN PELVIS W CONTRAST  Result Date: 08/19/2019 CLINICAL DATA:  Abdominal pain. EXAM: CT ABDOMEN AND PELVIS WITH CONTRAST TECHNIQUE: Multidetector CT imaging of the abdomen and pelvis was performed using the standard protocol following bolus administration of intravenous contrast. CONTRAST:  175m OMNIPAQUE IOHEXOL 300 MG/ML  SOLN COMPARISON:  CT scan 12/16/2009 FINDINGS: Lower chest: The lung bases are clear of acute process. No pleural effusion or pulmonary lesions. The heart is normal in size. No pericardial effusion. The distal esophagus and aorta are unremarkable. Hepatobiliary: No focal hepatic lesions. SPECT mild inflammatory changes involving segment 7 of the liver adjacent to the kidney with some slight biliary dilatation. This may be related to the inflammatory process involving the right kidney affecting the adjacent liver. The gallbladder is normal. Mild intra and extrahepatic biliary dilatation for age possibly related to patient's history of autoimmune liver disease. Pancreas: No mass, inflammation or ductal dilatation. Spleen: Normal size. No focal lesions. Adrenals/Urinary Tract: Adrenal glands are normal. The left kidney is normal. The right kidney demonstrates decreased perfusion involving the entire upper pole region with areas of low attenuation. There is surrounding inflammation and findings are most consistent with lobar nephronia/focal pyelonephritis with findings suspicious for early abscess. Recommend correlation with urinalysis. The bladder is normal. Stomach/Bowel: The stomach, duodenum, small bowel and colon are grossly normal. The terminal ileum and appendix are normal. Vascular/Lymphatic: The aorta is normal in caliber. No  dissection. The branch vessels are patent. The major venous structures are patent. No mesenteric or retroperitoneal mass or adenopathy. Small scattered lymph nodes are noted. Reproductive: The uterus and ovaries are unremarkable. Corpus luteum cyst noted on the right. Very prominent left-sided parametrial vessels consistent with pelvic congestion syndrome. Other: No pelvic mass or free pelvic fluid collections. Musculoskeletal: No significant bony findings. IMPRESSION: 1. CT findings consistent with lobar nephronia/focal pyelonephritis involving the upper pole region of the right kidney with possible early abscess formation. Recommend correlation with urinalysis. No renal or ureteral calculi. 2. Possible mild inflammatory process involving the right hepatic lobe adjacent to the right kidney. 3. Mild intra and extrahepatic biliary dilatation for age possibly related to patient's history of autoimmune liver disease. 4. Prominent left-sided parametrial vessels consistent with pelvic congestion syndrome. Electronically Signed   By: PMarijo SanesM.D.   On: 08/19/2019 06:09   [facility]  Result Date: 08/19/2019 CLINICAL DATA:  Epigastric pain EXAM: PORTABLE CHEST 1 VIEW COMPARISON:  None. FINDINGS: The heart size and mediastinal contours are within normal limits. Both lungs are clear. The visualized skeletal structures are unremarkable. IMPRESSION: No active disease. Electronically Signed   By: Inez Catalina M.D.   On: 08/19/2019 03:15   US ABDOMEN LIMITED RUQ  Result Date: 08/18/2019 CLINICAL DATA:  Right upper quadrant pain for 1 day, autoimmune liver disease, elevated LFTs EXAM: ULTRASOUND ABDOMEN LIMITED RIGHT UPPER QUADRANT COMPARISON:  01/10/2010 FINDINGS: Gallbladder: No gallstones or wall thickening visualized. No sonographic Murphy sign noted by sonographer. Common bile duct: Diameter: 4 mm Liver: The liver demonstrates grossly normal echotexture. Shadowing calcification right lobe liver measures  up to 1.3 cm, benign appearing. No focal liver lesion. Portal vein is patent on color Doppler imaging with normal direction of blood flow towards the liver. Other: Incidental note is made of a 5 mm hyperechoic focus within the anterior cortex of the right kidney, either a small cortical calcification or small renal angiomyolipoma. This is of doubtful clinical significance. IMPRESSION: 1. Coarse benign-appearing calcification right lobe liver. 2. Small cortical calcification versus angiomyolipoma right kidney, of doubtful clinical significance. 3. Otherwise unremarkable exam. Electronically Signed   By: Randa Ngo M.D.   On: 08/18/2019 14:26    EKG: Independently reviewed.  Sinus rhythm at 77 bpm  Assessment/Plan Epigastric pain: Patient presents with epigastric abdominal pain started acutely yesterday morning.   Imaging studies is noted some mild inflammation of the intra and extra hepatic biliary duct.  Not totally clear the cause of her pain, but she did note some mild improvement after receiving the GI cocktail. -Admit to med-surg bed -GI cocktail as needed indigestion -Morphine IV as needed for pain -Diet as tolerated     Hyperbilirubinemia and elevated liver enzymes: Acute.  Last hepatitis panel done in 2013.  Question if this is related to a flare of her autoimmune hepatitis. -Check PT/INR and acetaminophen level -Normal saline IV fluids 100 mL/h   -Check acute hepatitis panel,ESR, CRP, IGG, and TSH -Check CMP in a.m. -GI consulted, will follow-up for any further recommendation      Autoimmune hepatitis: Patient had not recently followed up in regards to treatment for her autoimmune hepatitis and was not on any medications at this time.  -Follow-up labs as seen above -Will follow-up with GI in regards to possibly starting steroid  Right kidney inflammation/ abnormal kidney function: Acute.  MRI of the abdomen and pelvis showing signs of suspect right-sided pyelonephritis with  possible early abscess.  However, patient has no CVA tenderness on physical exam and urinalysis was negative for any signs of infection.  Discussed case with Dr. Kathlene Cote of IR who did not appreciate anything significant to drain.  Recommended patient have outpatient follow-up and possibly obtain multiphase CT of the abdomen. -Check blood and urine cultures  -Monitoring off of antibiotics at this time  Tobacco use: Patient reports smoking tobacco. Declined need of nicotine patch. -Continue to counsel on need of cessation of tobacco use   DVT prophylaxis: Lovenox Code Status: Full Family Communication: No family requested to be updated Disposition Plan: Likely discharge home in 2 to 3 days once medically stable Consults called: GI Admission status: Inpatient  Norval Morton MD [facility] Pager [phone]   If 7PM-7AM, please contact night-coverage [email or web address] Password [facility]  08/19/2019, 7:52 AM

## 2019-08-19 NOTE — ED Triage Notes (Signed)
Pt comes for upper abd pain, seen here yesterday for the same, denies n/v/d

## 2019-08-19 NOTE — ED Notes (Signed)
Patient transported to MRI 

## 2019-08-19 NOTE — ED Notes (Signed)
Pt stated that she had a small amount of soup for dinner last night & she has not ate anything since then. However, she has sipped on water due to nausea & feeling dehydrated (per pt), MRI dept called about pts NPO status & they were made aware.

## 2019-08-19 NOTE — Consult Note (Signed)
Referring Provider: Dr. Tamala Julian Primary Care Physician:  [facility], [place] Not In Primary Gastroenterologist:  Althia Forts  Reason for Consultation:  Abdominal pain; Elevated LFTs  HPI: Katrina Lewis is a 44 y.o. female with a history of autoimmune hepatitis (AIH) who last saw me in 2011 but has been followed at [facility] until 2017. 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 [facility] felt her estrogen OCPs triggered her AIH and she stopped taking the Imuran that she had been on several years ago and had been on Prednisone years before that. Has felt fine until yesterday when she had the acute onset of severe epigastric pressure (denies that pain was sharp and denies any radiation of the pain) that was unrelenting and fatigue. Felt bloated as well. Denies N/V prior to admit but felt nauseous earlier today in the hospital. Denies F/C/melena/hematochezia. Denies dysuria, dark urine, or pruritus. Denies scleral icterus until today. TB 3.7, ALP 355 (293), AST 417 (333), ALT 514 (280), Lipase 17. MRCP concerning for acute right pyelonephritis and possible abscess in the right kidney. Question of small filling defect in the distal CBD. Mild intrahepatic and extrahepatic biliary dilation. Normal appearing gallbladder. Ate regular diet for dinner without problems.  Past Medical History:  Diagnosis Date  . 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 Bacterial vaginosis   . CIN I (cervical intraepithelial neoplasia I)     Past Surgical History:  Procedure Laterality Date  . LABIOPLASTY Left 03/02/2013   Procedure: LABIAPLASTY;  Surgeon: Delice Lesch, MD;  Location: [facility];  Service: Gynecology;  Laterality: Left;  Wide Local Excision of Left Labia  . LEEP  04/16/2010  . LIVER BIOPSY  2007  . WISDOM TOOTH EXTRACTION      Prior to Admission medications   Medication Sig Start Date End Date Taking? Authorizing Provider   amphetamine-dextroamphetamine (ADDERALL) 20 MG tablet Take 20 mg by mouth 2 (two) times daily.   Yes [provider]  WELLBUTRIN XL 150 MG 24 hr tablet Take 150 mg by mouth daily. 08/01/19  Yes [provider]  HYDROmorphone (DILAUDID) 2 MG tablet Take 1-2 tablets (2-4 mg total) by mouth every 6 (six) hours as needed for pain. Patient not taking: Reported on 08/19/2019 03/02/13   Everett Graff, MD  ibuprofen (ADVIL,MOTRIN) 600 MG tablet Take 1 tablet (600 mg total) by mouth every 6 (six) hours as needed for pain. Patient not taking: Reported on 08/19/2019 03/02/13   Everett Graff, MD    Scheduled Meds: . enoxaparin (LOVENOX) injection  40 mg Subcutaneous Q24H  . sodium chloride flush  3 mL Intravenous Once  . sodium chloride flush  3 mL Intravenous Q12H   Continuous Infusions: . sodium chloride 100 mL/hr at 08/19/19 0857   PRN Meds:.albuterol, alum & mag hydroxide-simeth **AND** [COMPLETED] lidocaine, morphine injection, ondansetron **OR** ondansetron (ZOFRAN) IV  Allergies as of 08/19/2019  . (No Known Allergies)    Family History  Problem Relation Age of Onset  . Diabetes Father   . Hypertension Father   . Hypertension Mother     Social History   Socioeconomic History  . Marital status: Single    Spouse name: Not on file  . Number of children: Not on file  . Years of education: Not on file  . Highest education level: Not on file  Occupational History  . Not on file  Tobacco Use  . Smoking status: Current Every  Day Smoker    Packs/day: 1.00    Years: 20.00    Pack years: 20.00    Types: Cigarettes  . Smokeless tobacco: Never Used  Substance and Sexual Activity  . Alcohol use: Yes    Comment: socially  . Drug use: No  . Sexual activity: Yes    Birth control/protection: None  Other Topics Concern  . Not on file  Social History Narrative  . Not on file   Social Determinants of Health   Financial Resource Strain:   . Difficulty of Paying  Living Expenses: Not on file  Food Insecurity:   . Worried About Charity fundraiser in the Last Year: Not on file  . Ran Out of Food in the Last Year: Not on file  Transportation Needs:   . Lack of Transportation (Medical): Not on file  . Lack of Transportation (Non-Medical): Not on file  Physical Activity:   . Days of Exercise per Week: Not on file  . Minutes of Exercise per Session: Not on file  Stress:   . Feeling of Stress : Not on file  Social Connections:   . Frequency of Communication with Friends and Family: Not on file  . Frequency of Social Gatherings with Friends and Family: Not on file  . Attends Religious Services: Not on file  . Active Member of Clubs or Organizations: Not on file  . Attends Archivist Meetings: Not on file  . Marital Status: Not on file  Intimate Partner Violence:   . Fear of Current or Ex-Partner: Not on file  . Emotionally Abused: Not on file  . Physically Abused: Not on file  . Sexually Abused: Not on file    Review of Systems: All negative except as stated above in HPI.  Physical Exam: Vital signs: Vitals:   08/19/19 1045 08/19/19 1341  BP: 139/85 139/80  Pulse: 71 67  Resp:  18  Temp:  98.6 F (37 C)  SpO2: 97% 100%   Last BM Date: 08/18/19 General:  Alert, thin, pleasant, no acute distress  Head: normocephalic, atraumatic Eyes: +icteric sclera ENT: oropharynx clear Neck: supple, nontender Lungs:  Clear throughout to auscultation.   No wheezes, crackles, or rhonchi. No acute distress. Heart:  Regular rate and rhythm; no murmurs, clicks, rubs,  or gallops. Abdomen: epigastric tenderness with guarding otherwise nontender, soft, nondistended, +BS  Rectal:  Deferred Ext: no edema  GI:  Lab Results: Recent Labs    08/18/19 1118 08/19/19 0129  WBC 11.2* 10.3  HGB 12.7 12.6  HCT 38.2 38.0  PLT 356 369   BMET Recent Labs    08/18/19 1118 08/19/19 0129  NA 138 140  K 4.1 3.7  CL 102 101  CO2 27 27  GLUCOSE  100* 100*  BUN 8 8  CREATININE 0.78 0.86  CALCIUM 9.3 9.3   LFT Recent Labs    08/19/19 0129  PROT 6.7  ALBUMIN 3.8  AST 417*  ALT 514*  ALKPHOS 355*  BILITOT 3.7*   PT/INR Recent Labs    08/19/19 1411  LABPROT 12.9  INR 1.0     Studies/Results: MR Abdomen W or Wo Contrast  Result Date: 08/19/2019 CLINICAL DATA:  Focal pyelonephritis suspected in the upper right kidney on CT study from earlier today. Concern for early abscess. Abdominal pain. EXAM: MRI ABDOMEN WITHOUT AND WITH CONTRAST TECHNIQUE: Multiplanar multisequence MR imaging of the abdomen was performed both before and after the administration of intravenous contrast. CONTRAST:  33mL  GADAVIST GADOBUTROL 1 MMOL/ML IV SOLN COMPARISON:  08/19/2019 CT abdomen/pelvis. FINDINGS: Lower chest: No acute abnormality at the lung bases. Hepatobiliary: Normal liver size and configuration. No significant hepatic steatosis. There is a 0.6 cm liver hemangioma in posterior segment 6 right liver lobe (series 4/image 26) with mild T2 hyperintensity and progressive enhancement, stable since 01/28/2010 MRI. No additional liver lesions. Normal gallbladder with no cholelithiasis. No biliary ductal dilatation. Common bile duct diameter 5 mm. There is a questionable small 4 mm low signal intensity filling defect in the lower third of the common bile duct (series 2/image 20). No biliary strictures, enhancing masses or beading. Generalized mild periportal edema. Pancreas: No pancreatic mass or duct dilation.  No pancreas divisum. Spleen: Normal size. No mass. Adrenals/Urinary Tract: Normal adrenals. There is diffuse parenchymal edema/thickening and heterogeneous hypoenhancement throughout the upper 2/3 of the right renal parenchyma, most compatible with acute pyelonephritis. There is a 2.2 x 1.4 cm cystic focus in the central upper right kidney (series 704/image 90) without restricted diffusion, suspected to represent upper pole caliectasis. Remaining right  renal collecting system is normal caliber. There is a 1.6 x 1.6 cm ill-defined focus of phlegmonous change/early abscess in the posterior interpolar right kidney (series 704/image 109). Normal left kidney with no left renal masses. No left hydronephrosis. Stomach/Bowel: Normal non-distended stomach. Visualized small and large bowel is normal caliber, with no bowel wall thickening. Vascular/Lymphatic: Normal caliber abdominal aorta. Patent portal, splenic, hepatic and renal veins. No pathologically enlarged lymph nodes in the abdomen. Other: No abdominal ascites or focal fluid collection. Musculoskeletal: No aggressive appearing focal osseous lesions. IMPRESSION: 1. Diffuse parenchymal edema/thickening and hypoenhancement throughout the upper 2/3 of the right kidney, most compatible with acute pyelonephritis. 2. Ill-defined 1.6 x 1.6 cm focus of phlegmonous change/early abscess formation in the posterior interpolar right kidney. 3. Central upper right renal 2.2 x 1.4 cm cystic focus, suspected to represent upper pole caliectasis. 4. Questionable 4 mm low signal intensity filling defect in the lower third of the common bile duct, cannot exclude sludge or tiny stone. No biliary ductal dilatation. CBD diameter 5 mm. Suggest correlation with serum bilirubin levels. 5. Suggest short-term follow-up MRI abdomen with MRCP without and with IV contrast after a course of antibiotic therapy. Electronically Signed   By: Ilona Sorrel M.D.   On: 08/19/2019 10:03   CT ABDOMEN PELVIS W CONTRAST  Result Date: 08/19/2019 CLINICAL DATA:  Abdominal pain. EXAM: CT ABDOMEN AND PELVIS WITH CONTRAST TECHNIQUE: Multidetector CT imaging of the abdomen and pelvis was performed using the standard protocol following bolus administration of intravenous contrast. CONTRAST:  130mL OMNIPAQUE IOHEXOL 300 MG/ML  SOLN COMPARISON:  CT scan 12/16/2009 FINDINGS: Lower chest: The lung bases are clear of acute process. No pleural effusion or pulmonary  lesions. The heart is normal in size. No pericardial effusion. The distal esophagus and aorta are unremarkable. Hepatobiliary: No focal hepatic lesions. SPECT mild inflammatory changes involving segment 7 of the liver adjacent to the kidney with some slight biliary dilatation. This may be related to the inflammatory process involving the right kidney affecting the adjacent liver. The gallbladder is normal. Mild intra and extrahepatic biliary dilatation for age possibly related to patient's history of autoimmune liver disease. Pancreas: No mass, inflammation or ductal dilatation. Spleen: Normal size. No focal lesions. Adrenals/Urinary Tract: Adrenal glands are normal. The left kidney is normal. The right kidney demonstrates decreased perfusion involving the entire upper pole region with areas of low attenuation. There is surrounding inflammation  and findings are most consistent with lobar nephronia/focal pyelonephritis with findings suspicious for early abscess. Recommend correlation with urinalysis. The bladder is normal. Stomach/Bowel: The stomach, duodenum, small bowel and colon are grossly normal. The terminal ileum and appendix are normal. Vascular/Lymphatic: The aorta is normal in caliber. No dissection. The branch vessels are patent. The major venous structures are patent. No mesenteric or retroperitoneal mass or adenopathy. Small scattered lymph nodes are noted. Reproductive: The uterus and ovaries are unremarkable. Corpus luteum cyst noted on the right. Very prominent left-sided parametrial vessels consistent with pelvic congestion syndrome. Other: No pelvic mass or free pelvic fluid collections. Musculoskeletal: No significant bony findings. IMPRESSION: 1. CT findings consistent with lobar nephronia/focal pyelonephritis involving the upper pole region of the right kidney with possible early abscess formation. Recommend correlation with urinalysis. No renal or ureteral calculi. 2. Possible mild inflammatory  process involving the right hepatic lobe adjacent to the right kidney. 3. Mild intra and extrahepatic biliary dilatation for age possibly related to patient's history of autoimmune liver disease. 4. Prominent left-sided parametrial vessels consistent with pelvic congestion syndrome. Electronically Signed   By: Marijo Sanes M.D.   On: 08/19/2019 06:09   DG Chest Port 1 View  Result Date: 08/19/2019 CLINICAL DATA:  Epigastric pain EXAM: PORTABLE CHEST 1 VIEW COMPARISON:  None. FINDINGS: The heart size and mediastinal contours are within normal limits. Both lungs are clear. The visualized skeletal structures are unremarkable. IMPRESSION: No active disease. Electronically Signed   By: Inez Catalina M.D.   On: 08/19/2019 03:15   US ABDOMEN LIMITED RUQ  Result Date: 08/18/2019 CLINICAL DATA:  Right upper quadrant pain for 1 day, autoimmune liver disease, elevated LFTs EXAM: ULTRASOUND ABDOMEN LIMITED RIGHT UPPER QUADRANT COMPARISON:  01/10/2010 FINDINGS: Gallbladder: No gallstones or wall thickening visualized. No sonographic Murphy sign noted by sonographer. Common bile duct: Diameter: 4 mm Liver: The liver demonstrates grossly normal echotexture. Shadowing calcification right lobe liver measures up to 1.3 cm, benign appearing. No focal liver lesion. Portal vein is patent on color Doppler imaging with normal direction of blood flow towards the liver. Other: Incidental note is made of a 5 mm hyperechoic focus within the anterior cortex of the right kidney, either a small cortical calcification or small renal angiomyolipoma. This is of doubtful clinical significance. IMPRESSION: 1. Coarse benign-appearing calcification right lobe liver. 2. Small cortical calcification versus angiomyolipoma right kidney, of doubtful clinical significance. 3. Otherwise unremarkable exam. Electronically Signed   By: Randa Ngo M.D.   On: 08/18/2019 14:26    Impression/Plan: 44 yo with elevated LFTs and epigastric pain with MRCP  showing a possible small distal filling defect in the distal common bile duct. I think her elevated LFTs are multifactorial due to her autoimmune hepatitis and/or infection. Doubt biliary obstruction and do not think she needs an ERCP. I would hold off on steroids for possible AIH due to concern for a possible infectious process. F/U LFTs tomorrow and if infection is ruled out and LFTs rise significantly then may need to start steroids. Will f/u.    LOS: 0 days   [name]  08/19/2019, 6:10 PM  Questions please call [phone]

## 2019-08-19 NOTE — ED Provider Notes (Signed)
[facility] EMERGENCY DEPARTMENT Provider Note  CSN: [id number] Arrival date & time: 08/19/19 0059  Chief Complaint(s) Abdominal Pain  HPI Katrina Lewis is a 44 y.o. female with a history of autoimmune hepatitis who presents to the emergency department with epigastric abdominal pain that began 24 to 48 hours ago.  Patient initially believed it was gas related but pain did not improve with Gas-X or Pepto.  Patient was seen earlier yesterday afternoon for this presentation.  Labs were notable for elevated LFTs with negative ultrasound for acute cholecystitis.  Patient was treated symptomatically and pain resolved.  Her pain returned tonight and she has not been able to control it.  She denies any associated nausea or vomiting.  No recent fevers or infections.  No cough or congestion.  No diarrhea.  Patient denies any new medication.  No alcohol consumption.  No drug use.     HPI  Past Medical History Past Medical History:  Diagnosis Date  . 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 Bacterial vaginosis   . CIN I (cervical intraepithelial neoplasia I)    Patient Active Problem List   Diagnosis Date Noted  . 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 Bacterial vaginosis   . CIN I (cervical intraepithelial neoplasia I)   . Condyloma 04/20/2012   Home Medication(s) Prior to Admission medications   Medication Sig Start Date End Date Taking? Authorizing Provider  amphetamine-dextroamphetamine (ADDERALL) 20 MG tablet Take 20 mg by mouth 2 (two) times daily.   Yes [provider]  WELLBUTRIN XL 150 MG 24 hr tablet Take 150 mg by mouth daily. 08/01/19  Yes [provider]  HYDROmorphone (DILAUDID) 2 MG tablet Take 1-2 tablets (2-4 mg total) by mouth every 6 (six) hours as needed for pain. Patient not taking: Reported on 08/19/2019 03/02/13   Everett Graff, MD  ibuprofen (ADVIL,MOTRIN) 600 MG tablet Take 1 tablet (600 mg total) by mouth every 6 (six) hours as needed for pain. Patient not taking: Reported on 08/19/2019 03/02/13   Everett Graff, MD                                                                                                                                    Past Surgical History Past Surgical History:  Procedure Laterality Date  . LABIOPLASTY Left 03/02/2013   Procedure: LABIAPLASTY;  Surgeon: Delice Lesch, MD;  Location: [facility];  Service: Gynecology;  Laterality: Left;  Wide Local Excision of Left Labia  . LEEP  04/16/2010  . LIVER BIOPSY  2007  . WISDOM TOOTH EXTRACTION     Family History Family History  Problem Relation Age of Onset  . Diabetes Father   . Hypertension Father   . Hypertension Mother     Social History Social History  Tobacco Use  . Smoking status: Current Every Day Smoker    Packs/day: 1.00    Years: 20.00    Pack years: 20.00    Types: Cigarettes  . Smokeless tobacco: Never Used  Substance Use Topics  . Alcohol use: Yes    Comment: socially  . Drug use: No   Allergies Patient has no known allergies.  Review of Systems Review of Systems All other systems are reviewed and are negative for acute change except as noted in the HPI  Physical Exam Vital Signs  I have reviewed the triage vital signs BP 132/74 (BP Location: Left Arm)   Pulse 72   Temp 98.3 F (36.8 C) (Oral)   Resp 16   Ht 5\' 3"  (1.6 m)   Wt 59 kg   SpO2 100%   BMI 23.03 kg/m   Physical Exam Vitals reviewed.  Constitutional:      General: She is not in acute distress.    Appearance: She is well-developed. She is not diaphoretic.  HENT:     Head: Normocephalic and atraumatic.     Right Ear: External ear normal.     Left Ear: External ear normal.     Nose: Nose normal.  Eyes:     General: No scleral icterus.       Right eye: No discharge.        Left eye: No discharge.     Conjunctiva/sclera:  Conjunctivae normal.     Pupils: Pupils are equal, round, and reactive to light.  Neck:     Trachea: Phonation normal.  Cardiovascular:     Rate and Rhythm: Normal rate and regular rhythm.     Heart sounds: No murmur. No friction rub. No gallop.   Pulmonary:     Effort: Pulmonary effort is normal. No respiratory distress.     Breath sounds: No stridor. Examination of the right-lower field reveals rales. Examination of the left-lower field reveals rales. Rales (fine) present.  Abdominal:     General: There is no distension.     Palpations: Abdomen is soft.     Tenderness: There is abdominal tenderness in the epigastric area. There is guarding. There is no rebound. Negative signs include Murphy's sign and Rovsing's sign.  Musculoskeletal:        General: No tenderness. Normal range of motion.     Cervical back: Normal range of motion and neck supple.  Skin:    General: Skin is warm and dry.     Findings: No erythema or rash.  Neurological:     Mental Status: She is alert and oriented to person, place, and time.  Psychiatric:        Behavior: Behavior normal.     ED Results and Treatments Labs (all labs ordered are listed, but only abnormal results are displayed) Labs Reviewed  COMPREHENSIVE METABOLIC PANEL - Abnormal; Notable for the following components:      Result Value   Glucose, Bld 100 (*)    AST 417 (*)    ALT 514 (*)    Alkaline Phosphatase 355 (*)    Total Bilirubin 3.7 (*)    All other components within normal limits  URINALYSIS, ROUTINE W REFLEX MICROSCOPIC - Abnormal; Notable for the following components:   Color, Urine AMBER (*)    APPearance CLOUDY (*)    Bilirubin Urine SMALL (*)    Ketones, ur 20 (*)    All other components within normal limits  SARS CORONAVIRUS 2 (TAT 6-24 HRS)  LIPASE, BLOOD  CBC  I-STAT BETA HCG BLOOD, ED ([facility], [facility], [facility] ONLY)  POC SARS CORONAVIRUS 2 AG -  ED                                                                                                                          EKG  EKG Interpretation  Date/Time:    Ventricular Rate:    PR Interval:    QRS Duration:   QT Interval:    QTC Calculation:   R Axis:     Text Interpretation:        Radiology CT ABDOMEN PELVIS W CONTRAST  Result Date: 08/19/2019 CLINICAL DATA:  Abdominal pain. EXAM: CT ABDOMEN AND PELVIS WITH CONTRAST TECHNIQUE: Multidetector CT imaging of the abdomen and pelvis was performed using the standard protocol following bolus administration of intravenous contrast. CONTRAST:  12mL OMNIPAQUE IOHEXOL 300 MG/ML  SOLN COMPARISON:  CT scan 12/16/2009 FINDINGS: Lower chest: The lung bases are clear of acute process. No pleural effusion or pulmonary lesions. The heart is normal in size. No pericardial effusion. The distal esophagus and aorta are unremarkable. Hepatobiliary: No focal hepatic lesions. SPECT mild inflammatory changes involving segment 7 of the liver adjacent to the kidney with some slight biliary dilatation. This may be related to the inflammatory process involving the right kidney affecting the adjacent liver. The gallbladder is normal. Mild intra and extrahepatic biliary dilatation for age possibly related to patient's history of autoimmune liver disease. Pancreas: No mass, inflammation or ductal dilatation. Spleen: Normal size. No focal lesions. Adrenals/Urinary Tract: Adrenal glands are normal. The left kidney is normal. The right kidney demonstrates decreased perfusion involving the entire upper pole region with areas of low attenuation. There is surrounding inflammation and findings are most consistent with lobar nephronia/focal pyelonephritis with findings suspicious for early abscess. Recommend correlation with urinalysis. The bladder is normal. Stomach/Bowel: The stomach, duodenum, small bowel and colon are grossly normal. The terminal ileum and appendix are normal. Vascular/Lymphatic: The aorta is normal in caliber. No dissection. The branch  vessels are patent. The major venous structures are patent. No mesenteric or retroperitoneal mass or adenopathy. Small scattered lymph nodes are noted. Reproductive: The uterus and ovaries are unremarkable. Corpus luteum cyst noted on the right. Very prominent left-sided parametrial vessels consistent with pelvic congestion syndrome. Other: No pelvic mass or free pelvic fluid collections. Musculoskeletal: No significant bony findings. IMPRESSION: 1. CT findings consistent with lobar nephronia/focal pyelonephritis involving the upper pole region of the right kidney with possible early abscess formation. Recommend correlation with urinalysis. No renal or ureteral calculi. 2. Possible mild inflammatory process involving the right hepatic lobe adjacent to the right kidney. 3. Mild intra and extrahepatic biliary dilatation for age possibly related to patient's history of autoimmune liver disease. 4. Prominent left-sided parametrial vessels consistent with pelvic congestion syndrome. Electronically Signed   By: Marijo Sanes M.D.   On: 08/19/2019 06:09   DG Chest Port 1 View  Result Date: 08/19/2019  CLINICAL DATA:  Epigastric pain EXAM: PORTABLE CHEST 1 VIEW COMPARISON:  None. FINDINGS: The heart size and mediastinal contours are within normal limits. Both lungs are clear. The visualized skeletal structures are unremarkable. IMPRESSION: No active disease. Electronically Signed   By: Inez Catalina M.D.   On: 08/19/2019 03:15   US ABDOMEN LIMITED RUQ  Result Date: 08/18/2019 CLINICAL DATA:  Right upper quadrant pain for 1 day, autoimmune liver disease, elevated LFTs EXAM: ULTRASOUND ABDOMEN LIMITED RIGHT UPPER QUADRANT COMPARISON:  01/10/2010 FINDINGS: Gallbladder: No gallstones or wall thickening visualized. No sonographic Murphy sign noted by sonographer. Common bile duct: Diameter: 4 mm Liver: The liver demonstrates grossly normal echotexture. Shadowing calcification right lobe liver measures up to 1.3 cm, benign  appearing. No focal liver lesion. Portal vein is patent on color Doppler imaging with normal direction of blood flow towards the liver. Other: Incidental note is made of a 5 mm hyperechoic focus within the anterior cortex of the right kidney, either a small cortical calcification or small renal angiomyolipoma. This is of doubtful clinical significance. IMPRESSION: 1. Coarse benign-appearing calcification right lobe liver. 2. Small cortical calcification versus angiomyolipoma right kidney, of doubtful clinical significance. 3. Otherwise unremarkable exam. Electronically Signed   By: Randa Ngo M.D.   On: 08/18/2019 14:26    Pertinent labs & imaging results that were available during my care of the patient were reviewed by me and considered in my medical decision making (see chart for details).  Medications Ordered in ED Medications  sodium chloride flush (NS) 0.9 % injection 3 mL (3 mLs Intravenous Not Given 08/19/19 0203)  alum & mag hydroxide-simeth (MAALOX/MYLANTA) 200-200-20 MG/5ML suspension 30 mL (30 mLs Oral Given 08/19/19 0301)    And  lidocaine (XYLOCAINE) 2 % viscous mouth solution 15 mL (15 mLs Oral Given 08/19/19 0302)  iohexol (OMNIPAQUE) 300 MG/ML solution 100 mL (100 mLs Intravenous Contrast Given 08/19/19 0518)                                                                                                                                    Procedures Procedures  (including critical care time)  Medical Decision Making / ED Course I have reviewed the nursing notes for this encounter and the patient's prior records (if available in EHR or on provided paperwork).   Rheann Boehnlein was evaluated in Emergency Department on 08/19/2019 for the symptoms described in the history of present illness. She was evaluated in the context of the global COVID-19 pandemic, which necessitated consideration that the patient might be at risk for infection with the SARS-CoV-2 virus that causes COVID-19.  Institutional protocols and algorithms that pertain to the evaluation of patients at risk for COVID-19 are in a state of rapid change based on information released by regulatory bodies including the CDC and federal and state organizations. These policies and algorithms were followed during the patient's care in the ED.  Patient returns  with worsening epigastric abdominal pain.  Part of the work-up yesterday was going to involve CT scan which patient declined given her improvement.  Repeat labs here are notable for worsening LFTs and now hyperbilirubinemia not seen yesterday.  This may be related to flare of her autoimmune hepatitis.  No evidence of pancreatitis.  Also noted fine rales in lower lung fields.  Patient denies any known Covid exposure or symptoms.  We will expand the work-up to include chest x-ray and obtain a CT of the abdomen.  Will initially treat with GI cocktail and reassess.  6:26 AM Pain improved with GI cocktail. CT notable for right renal abnormality initially concerning for localized/lobar infection which appear to be causing inflammation to the lower lobe of the liver however the patient's UA is negative and patient does not have any leukocytosis that would correlate with an infectious process.  I discussed this further with radiology and they recommended MRI with contrast to assess further.   Patient updated.  Now she is noted to have scleral icterus which was not there during my initial evaluation.  Plan is to admit the patient to trend LFTs and obtain the MRI.  Case discussed with Dr. Tamala Julian who will admit the patient.      Final Clinical Impression(s) / ED Diagnoses Final diagnoses:  Epigastric abdominal pain  Transaminitis  Right renal mass      This chart was dictated using voice recognition software.  Despite best efforts to proofread,  errors can occur which can change the documentation meaning.   Fatima Blank, MD 08/19/19 [phone]

## 2019-08-20 DIAGNOSIS — R1011 Right upper quadrant pain: Secondary | ICD-10-CM

## 2019-08-20 DIAGNOSIS — N10 Acute tubulo-interstitial nephritis: Principal | ICD-10-CM

## 2019-08-20 LAB — COMPREHENSIVE METABOLIC PANEL
ALT: 330 U/L — ABNORMAL HIGH (ref 0–44)
AST: 114 U/L — ABNORMAL HIGH (ref 15–41)
Albumin: 3 g/dL — ABNORMAL LOW (ref 3.5–5.0)
Alkaline Phosphatase: 341 U/L — ABNORMAL HIGH (ref 38–126)
Anion gap: 4 — ABNORMAL LOW (ref 5–15)
BUN: 9 mg/dL (ref 6–20)
CO2: 24 mmol/L (ref 22–32)
Calcium: 8.5 mg/dL — ABNORMAL LOW (ref 8.9–10.3)
Chloride: 109 mmol/L (ref 98–111)
Creatinine, Ser: 0.77 mg/dL (ref 0.44–1.00)
GFR calc Af Amer: >60 mL/min (ref >60)
GFR calc non Af Amer: >60 mL/min (ref >60)
Glucose, Bld: 102 mg/dL — ABNORMAL HIGH (ref 70–99)
Potassium: 3.3 mmol/L — ABNORMAL LOW (ref 3.5–5.1)
Sodium: 137 mmol/L (ref 135–145)
Total Bilirubin: 1.1 mg/dL (ref 0.3–1.2)
Total Protein: 5.6 g/dL — ABNORMAL LOW (ref 6.5–8.1)

## 2019-08-20 LAB — CBC
HCT: 34.1 % — ABNORMAL LOW (ref 36.0–46.0)
Hemoglobin: 11.5 g/dL — ABNORMAL LOW (ref 12.0–15.0)
MCH: 31 pg (ref 26.0–34.0)
MCHC: 33.7 g/dL (ref 30.0–36.0)
MCV: 91.9 fL (ref 80.0–100.0)
Platelets: 325 10*3/uL (ref 150–400)
RBC: 3.71 MIL/uL — ABNORMAL LOW (ref 3.87–5.11)
RDW: 13.4 % (ref 11.5–15.5)
WBC: 9.6 10*3/uL (ref 4.0–10.5)
nRBC: 0 % (ref 0.0–0.2)

## 2019-08-20 LAB — IGG: IgG (Immunoglobin G), Serum: 603 mg/dL (ref 586–1602)

## 2019-08-20 MED ORDER — POTASSIUM CHLORIDE CRYS ER 20 MEQ PO TBCR
40.0000 meq | EXTENDED_RELEASE_TABLET | ORAL | Status: AC
  Administered 2019-08-20 (×2): 40 meq via ORAL
  Filled 2019-08-20 (×2): qty 2

## 2019-08-20 MED ORDER — SODIUM CHLORIDE 0.9 % IV SOLN
2.0000 g | INTRAVENOUS | Status: DC
  Administered 2019-08-20 – 2019-08-22 (×3): 2 g via INTRAVENOUS
  Filled 2019-08-20 (×3): qty 2

## 2019-08-20 MED ORDER — PANTOPRAZOLE SODIUM 40 MG PO TBEC
40.0000 mg | DELAYED_RELEASE_TABLET | Freq: Every day | ORAL | Status: DC
  Administered 2019-08-20 – 2019-08-22 (×3): 40 mg via ORAL
  Filled 2019-08-20 (×3): qty 1

## 2019-08-20 NOTE — Progress Notes (Signed)
[facility] Gastroenterology Progress Note  Katrina Lewis 44 y.o. 1976/06/20   Subjective: Had severe epigastric pain this morning improving after IV Morphine. Denies N/V. Nonbloody BM reported.  Objective: Vital signs: Vitals:   08/20/19 0827 08/20/19 1209  BP: 120/81 130/85  Pulse: 72 68  Resp: 18 16  Temp: 98.7 F (37.1 C) 98 F (36.7 C)  SpO2: 99% 98%    Physical Exam: Gen: alert, no acute distress, pleasant, thin HEENT: anicteric sclera CV: RRR Chest: CTA B Abd: epigastric tenderness with guarding, soft, nondistended, +BS Ext: no edema  Lab Results: Recent Labs    08/19/19 0129 08/20/19 0751  NA 140 137  K 3.7 3.3*  CL 101 109  CO2 27 24  GLUCOSE 100* 102*  BUN 8 9  CREATININE 0.86 0.77  CALCIUM 9.3 8.5*   Recent Labs    08/19/19 0129 08/20/19 0751  AST 417* 114*  ALT 514* 330*  ALKPHOS 355* 341*  BILITOT 3.7* 1.1  PROT 6.7 5.6*  ALBUMIN 3.8 3.0*   Recent Labs    08/19/19 0129 08/20/19 0751  WBC 10.3 9.6  HGB 12.6 11.5*  HCT 38.0 34.1*  MCV 93.6 91.9  PLT 369 325      Assessment/Plan: 44 yo with elevated LFTs and history of autoimmune hepatitis who has a question of pyelonephritis on MRCP on IV Abx. Epigastric pain of unclear etiology. LFTs are improving without steroids. Question infectious source. Agree with PPI PO and anti-emetics. Agree with changing to soft diet. If abd pain persists, then may need an EGD prior to d/c but for now continue supportive care. Hold off on steroids due to infectious process present and improvement of LFTs. Will follow.   [name] 08/20/2019, 12:22 PM  Questions please call [phone] ID: Katrina Lewis, female   DOB: 11-Jan-1976, 44 y.o.   MRN: [id number]

## 2019-08-20 NOTE — Progress Notes (Signed)
Patient continue to c/o abdominal pain. She verbalizes morphine does give relief, but pain come back after couplek. She has loss appetite since this morning after breakfast.

## 2019-08-20 NOTE — Progress Notes (Signed)
PROGRESS NOTE  Katrina Lewis AQT:[id number] DOB: [birth year]   PCP: System, Pcp Not In  Patient is from: Home  DOA: 08/19/2019 LOS: 1  Brief Narrative / Interim history: 44 year old female with history of autoimmune hepatitis thought to be due to OCP presenting with epigastric pain, sense of bloating, nausea and fatigue for 1 day.  Evaluated in ED and discharged home but had to return to daycare severe epigastric pain that woke her up from sleep at midnight.  Has not seen his GI doctor at [facility] in the last 3 years.   In ED, hemodynamically stable.  WBC 10.3.  AST 417.  ALT 514.  Total bili 3.7.  COVID-19 PCR negative.  Urinalysis not impressive.  CT abdomen and pelvis concerning for focal pyelonephritis of upper pole of right kidney with possible early abscess, mild intra and extrahepatic biliary dilation and mild inflammatory changes of the right hepatic lobe adjacent to the right kidney.  MRI abdomen ordered and she was admitted.  MRI abdomen also concerning for acute pyelonephritis of right kidney with ill-defined 1.6 x 1.6 cm focus of phlegmonous change, 2.2 x 1.4 cm cystic focus of right kidney, and questionable 4 mm low signal intensity filling defect in the lower third of CBD raising concern for sludge or tiny stone.    IR consulted by admitting provider about right kidney phlegmonous change/possible early abscess but "did not appreciate anything significant to drain, I recommended multiphase CT of the abdomen".  GI consulted and recommended supportive care and trending LFT.   Subjective: No major events overnight.  She had severe epigastric pain after she ate her breakfast this morning.  Pain improved with IV morphine.  Denies fever, chills, chest pain, dyspnea, cough, nausea, vomiting, diarrhea or UTI symptoms.  Denies new back pain.  Denies new medication or NSAID use.  Objective: Vitals:   08/19/19 2335 08/20/19 0449 08/20/19 0827 08/20/19 1209  BP: 121/78 118/77 120/81 130/85    Pulse: 71 71 72 68  Resp: '18 18 18 16  ' Temp: 98.4 F (36.9 C) 98.9 F (37.2 C) 98.7 F (37.1 C) 98 F (36.7 C)  TempSrc: Oral Oral Oral Oral  SpO2: 97% 98% 99% 98%  Weight:      Height:        Intake/Output Summary (Last 24 hours) at 08/20/2019 1418 Last data filed at 08/19/2019 2000 Gross per 24 hour  Intake 123 ml  Output --  Net 123 ml   Filed Weights   08/19/19 0213  Weight: 59 kg    Examination:  GENERAL: No acute distress.  Appears well.  HEENT: MMM.  Vision and hearing grossly intact.  NECK: Supple.  No apparent JVD.  RESP:  No IWOB. Good air movement bilaterally. CVS:  RRR. Heart sounds normal.  ABD/GI/GU: Bowel sounds present. Soft.  Epigastric, RUQ and RLQ pain.  No CVA tenderness MSK/EXT:  Moves extremities. No apparent deformity. No edema.  SKIN: no apparent skin lesion or wound NEURO: Awake, alert and oriented appropriately.  No apparent focal neuro deficit. PSYCH: Calm. Normal affect.  Procedures:  None  Assessment & Plan: Epigastric/RUQ/RLQ pain: her symptoms and exam concerning for biliary process.  She had severe epigastric/RUQ pain this morning after he ate breakfast.  She also has tenderness more prominent over RUQ.  LFT consistent with obstructive etiology.  MRI abdomen questionable 4 mm low signal intensity filling defect in the lower third of CBD raising concern for sludge or tiny stone.  Fortunately, LFT improving.  Hyperbilirubinemia resolved. -  GI following-appreciate insight and guidance. -Continue supportive care with GI cocktail.  Added Protonix. -Change diet to soft -Continue trending LFT  Elevated liver enzymes/hyperbilirubinemia/alkaline phosphatase: Improved.  Likely due to the above.  Acute hepatitis panel, HIV and COVID-19 negative.  CRP within normal.  ESR mildly elevated. -GI following-no steroid at this time  History of autoimmune hepatitis:  -Consider autoimmune work-up if no improvement in LFT.  Acute pyelonephritis: patient  without UTI symptoms, fever, chills or CVA tenderness.  However, CT and MRI finding concerning for acute pyelonephritis with possible early abscess.  Admitting MD discussed with IR who did not appreciate anything drainable but recommended outpatient follow-up and possibly multiphase CT of the abdomen.  I would favor treating as pyelonephritis vs wait and watch.  Blood cultures negative. -Start ceftriaxone and adjust antibiotics based on urine culture-even if negative, will complete course with Levaquin -Follow urine cultures -May need repeat CT outpatient after treatment course.  Tobacco use disorder: -Encourage cessation -Declined nicotine patch                DVT prophylaxis: Subcu Lovenox Code Status: Full code Family Communication: Patient and/or RN. Available if any question.   Discharge barrier: Abdominal pain, elevated liver enzymes and acute pyelonephritis Patient is from: Home Final disposition: Likely home once medically stable and cleared by GI  Consultants: Gastroenterology   Microbiology summarized: Influenza PCR negative COVID-19 PCR negative Blood cultures negative so far Urine culture pending  Sch Meds:  Scheduled Meds: . enoxaparin (LOVENOX) injection  40 mg Subcutaneous Q24H  . pantoprazole  40 mg Oral Daily  . potassium chloride  40 mEq Oral Q4H  . sodium chloride flush  3 mL Intravenous Once  . sodium chloride flush  3 mL Intravenous Q12H   Continuous Infusions: . sodium chloride 100 mL/hr at 08/20/19 0833  . cefTRIAXone (ROCEPHIN)  IV 2 g (08/20/19 1155)   PRN Meds:.albuterol, alum & mag hydroxide-simeth **AND** [COMPLETED] lidocaine, morphine injection, ondansetron **OR** ondansetron (ZOFRAN) IV  Antimicrobials: Anti-infectives (From admission, onward)   Start     Dose/Rate Route Frequency Ordered Stop   08/20/19 1100  cefTRIAXone (ROCEPHIN) 2 g in sodium chloride 0.9 % 100 mL IVPB     2 g 200 mL/hr over 30 Minutes Intravenous Every 24 hours  08/20/19 1053         I have personally reviewed the following labs and images: CBC: Recent Labs  Lab 08/18/19 1118 08/19/19 0129 08/20/19 0751  WBC 11.2* 10.3 9.6  HGB 12.7 12.6 11.5*  HCT 38.2 38.0 34.1*  MCV 93.9 93.6 91.9  PLT 356 369 325   BMP &GFR Recent Labs  Lab 08/18/19 1118 08/19/19 0129 08/20/19 0751  NA 138 140 137  K 4.1 3.7 3.3*  CL 102 101 109  CO2 '27 27 24  ' GLUCOSE 100* 100* 102*  BUN '8 8 9  ' CREATININE 0.78 0.86 0.77  CALCIUM 9.3 9.3 8.5*   Estimated Creatinine Clearance: 75.8 mL/min (by C-G formula based on SCr of 0.77 mg/dL). Liver & Pancreas: Recent Labs  Lab 08/18/19 1118 08/19/19 0129 08/20/19 0751  AST 333* 417* 114*  ALT 280* 514* 330*  ALKPHOS 293* 355* 341*  BILITOT 1.2 3.7* 1.1  PROT 6.5 6.7 5.6*  ALBUMIN 3.9 3.8 3.0*   Recent Labs  Lab 08/18/19 1118 08/19/19 0129  LIPASE 16 17   No results for input(s): AMMONIA in the last 168 hours. Diabetic: No results for input(s): HGBA1C in the last 72 hours. No results  for input(s): GLUCAP in the last 168 hours. Cardiac Enzymes: No results for input(s): CKTOTAL, CKMB, CKMBINDEX, TROPONINI in the last 168 hours. No results for input(s): PROBNP in the last 8760 hours. Coagulation Profile: Recent Labs  Lab 08/19/19 1411  INR 1.0   Thyroid Function Tests: Recent Labs    08/19/19 1411  TSH 2.621   Lipid Profile: No results for input(s): CHOL, HDL, LDLCALC, TRIG, CHOLHDL, LDLDIRECT in the last 72 hours. Anemia Panel: No results for input(s): VITAMINB12, FOLATE, FERRITIN, TIBC, IRON, RETICCTPCT in the last 72 hours. Urine analysis:    Component Value Date/Time   COLORURINE AMBER (A) 08/19/2019 0108   APPEARANCEUR CLOUDY (A) 08/19/2019 0108   LABSPEC 1.021 08/19/2019 0108   PHURINE 6.0 08/19/2019 0108   GLUCOSEU NEGATIVE 08/19/2019 0108   HGBUR NEGATIVE 08/19/2019 0108   BILIRUBINUR SMALL (A) 08/19/2019 0108   KETONESUR 20 (A) 08/19/2019 0108   PROTEINUR NEGATIVE 08/19/2019  0108   NITRITE NEGATIVE 08/19/2019 0108   LEUKOCYTESUR NEGATIVE 08/19/2019 0108   Sepsis Labs: Invalid input(s): PROCALCITONIN, [place]  Microbiology: Recent Results (from the past 240 hour(s))  SARS CORONAVIRUS 2 (TAT 6-24 HRS) Nasopharyngeal Nasopharyngeal Swab     Status: None   Collection Time: 08/19/19  7:43 AM   Specimen: Nasopharyngeal Swab  Result Value Ref Range Status   SARS Coronavirus 2 NEGATIVE NEGATIVE Final    Comment: (NOTE) SARS-CoV-2 target nucleic acids are NOT DETECTED. The SARS-CoV-2 RNA is generally detectable in upper and lower respiratory specimens during the acute phase of infection. Negative results do not preclude SARS-CoV-2 infection, do not rule out co-infections with other pathogens, and should not be used as the sole basis for treatment or other patient management decisions. Negative results must be combined with clinical observations, patient history, and epidemiological information. The expected result is Negative. Fact Sheet for Patients: SugarRoll.be Fact Sheet for Healthcare Providers: https://www.woods-mathews.com/ This test is not yet approved or cleared by the [place] FDA and  has been authorized for detection and/or diagnosis of SARS-CoV-2 by FDA under an Emergency Use Authorization (EUA). This EUA will remain  in effect (meaning this test can be used) for the duration of the COVID-19 declaration under Section 56 4(b)(1) of the Act, 21 U.S.C. section 360bbb-3(b)(1), unless the authorization is terminated or revoked sooner. Performed at [facility], [place] [address] [place], [place] [address]   Culture, blood (routine x 2)     Status: None (Preliminary result)   Collection Time: 08/19/19  2:10 PM   Specimen: BLOOD RIGHT ARM  Result Value Ref Range Status   Specimen Description BLOOD RIGHT ARM  Final   Special Requests   Final    BOTTLES DRAWN AEROBIC AND ANAEROBIC Blood Culture  adequate volume   Culture   Final    NO GROWTH < 24 HOURS Performed at [facility], [place] [address] [place], [place] [address]    Report Status PENDING  Incomplete  Culture, blood (routine x 2)     Status: None (Preliminary result)   Collection Time: 08/19/19  2:15 PM   Specimen: BLOOD RIGHT ARM  Result Value Ref Range Status   Specimen Description BLOOD RIGHT ARM  Final   Special Requests   Final    BOTTLES DRAWN AEROBIC AND ANAEROBIC Blood Culture adequate volume   Culture   Final    NO GROWTH < 24 HOURS Performed at [facility], [place] [address] [place], [place] [address]    Report  Status PENDING  Incomplete    Radiology Studies: No results found.    Ion Gonnella T. [place]  If 7PM-7AM, please contact night-coverage [email or web address] Password [email or web address] 08/20/2019, 2:18 PM

## 2019-08-20 NOTE — Progress Notes (Signed)
Patient c/o abdominal pain, verbalized that  Possible result from breakfast this AM. Morphine given per [facility]. Will notified MD for possible alternative  diet regimen.

## 2019-08-21 LAB — COMPREHENSIVE METABOLIC PANEL
ALT: 245 U/L — ABNORMAL HIGH (ref 0–44)
AST: 56 U/L — ABNORMAL HIGH (ref 15–41)
Albumin: 2.9 g/dL — ABNORMAL LOW (ref 3.5–5.0)
Alkaline Phosphatase: 300 U/L — ABNORMAL HIGH (ref 38–126)
Anion gap: 7 (ref 5–15)
BUN: <5 mg/dL — ABNORMAL LOW (ref 6–20)
CO2: 20 mmol/L — ABNORMAL LOW (ref 22–32)
Calcium: 8.5 mg/dL — ABNORMAL LOW (ref 8.9–10.3)
Chloride: 110 mmol/L (ref 98–111)
Creatinine, Ser: 0.63 mg/dL (ref 0.44–1.00)
GFR calc Af Amer: >60 mL/min (ref >60)
GFR calc non Af Amer: >60 mL/min (ref >60)
Glucose, Bld: 84 mg/dL (ref 70–99)
Potassium: 4.2 mmol/L (ref 3.5–5.1)
Sodium: 137 mmol/L (ref 135–145)
Total Bilirubin: 0.7 mg/dL (ref 0.3–1.2)
Total Protein: 5.3 g/dL — ABNORMAL LOW (ref 6.5–8.1)

## 2019-08-21 LAB — CBC
HCT: 32.7 % — ABNORMAL LOW (ref 36.0–46.0)
Hemoglobin: 11 g/dL — ABNORMAL LOW (ref 12.0–15.0)
MCH: 30.8 pg (ref 26.0–34.0)
MCHC: 33.6 g/dL (ref 30.0–36.0)
MCV: 91.6 fL (ref 80.0–100.0)
Platelets: 281 10*3/uL (ref 150–400)
RBC: 3.57 MIL/uL — ABNORMAL LOW (ref 3.87–5.11)
RDW: 13.2 % (ref 11.5–15.5)
WBC: 9.1 10*3/uL (ref 4.0–10.5)
nRBC: 0 % (ref 0.0–0.2)

## 2019-08-21 LAB — LIPASE, BLOOD: Lipase: 17 U/L (ref 11–51)

## 2019-08-21 LAB — PHOSPHORUS: Phosphorus: 2.5 mg/dL (ref 2.5–4.6)

## 2019-08-21 LAB — MAGNESIUM: Magnesium: 1.7 mg/dL (ref 1.7–2.4)

## 2019-08-21 MED ORDER — SODIUM CHLORIDE 0.9 % IV SOLN: INTRAVENOUS | Status: DC

## 2019-08-21 MED ORDER — MORPHINE SULFATE (PF) 2 MG/ML IV SOLN: 1.0000 mg | INTRAVENOUS | Status: DC | PRN

## 2019-08-21 NOTE — Progress Notes (Signed)
PROGRESS NOTE  Katrina Lewis BPJ:[id number] DOB: 1975-08-22   PCP: System, Pcp Not In  Patient is from: Home  DOA: 08/19/2019 LOS: 2  Brief Narrative / Interim history: 44 year old female with history of autoimmune hepatitis thought to be due to OCP presenting with epigastric pain, sense of bloating, nausea and fatigue for 1 day.  Evaluated in ED and discharged home but had to return to daycare severe epigastric pain that woke her up from sleep at midnight.  Has not seen his GI doctor at [facility] in the last 3 years.   In ED, hemodynamically stable.  WBC 10.3.  AST 417.  ALT 514.  Total bili 3.7.  COVID-19 PCR negative.  Urinalysis not impressive.  CT abdomen and pelvis concerning for focal pyelonephritis of upper pole of right kidney with possible early abscess, mild intra and extrahepatic biliary dilation and mild inflammatory changes of the right hepatic lobe adjacent to the right kidney.  MRI abdomen ordered and she was admitted.  MRI abdomen also concerning for acute pyelonephritis of right kidney with ill-defined 1.6 x 1.6 cm focus of phlegmonous change, 2.2 x 1.4 cm cystic focus of right kidney, and questionable 4 mm low signal intensity filling defect in the lower third of CBD raising concern for sludge or tiny stone.    IR consulted by admitting provider about right kidney phlegmonous change/possible early abscess but "did not appreciate anything significant to drain, I recommended multiphase CT of the abdomen".  GI consulted and planning EGD on 2/8  Subjective: No major events overnight or this morning.  Continues to endorse epigastric pain slightly lower.  She is anxious to eat.  She said hip pain gotten worse after some soft diet this morning but not as bad as yesterday.  She also reports right-sided back pain now.  Has not had a bowel movement in 2 days now.  Denies chest pain, dyspnea, cough or UTI symptoms.  Objective: Vitals:   08/20/19 1810 08/20/19 2359 08/21/19 0543 08/21/19  0904  BP: 131/76 126/79 115/78 122/71  Pulse: 76 65 63 68  Resp: _0 Temp: 98.2 F (36.8 C) 98.2 F (36.8 C) 98.5 F (36.9 C) 99 F (37.2 C)  TempSrc: Oral Oral Oral Oral  SpO2: 99% 97% 97% 100%  Weight:      Height:        Intake/Output Summary (Last 24 hours) at 08/21/2019 1219 Last data filed at 08/21/2019 0900 Gross per 24 hour  Intake 2280 ml  Output --  Net 2280 ml   Filed Weights   08/19/19 0213  Weight: 59 kg    Examination:  GENERAL: No acute distress.  Appears well.  HEENT: MMM.  Vision and hearing grossly intact.  NECK: Supple.  No apparent JVD.  RESP:  No IWOB. Good air movement bilaterally. CVS:  RRR. Heart sounds normal.  ABD/GI/GU: Bowel sounds present. Soft.  Epigastric, RUQ and RLQ pain.  No CVA tenderness. MSK/EXT:  Moves extremities. No apparent deformity. No edema.  SKIN: no apparent skin lesion or wound NEURO: Awake, alert and oriented appropriately.  No apparent focal neuro deficit. PSYCH: Calm. Normal affect.  Procedures:  None  Assessment & Plan: Epigastric/RUQ/RLQ pain: her symptoms and exam concerning for biliary process and/or gastritis/peptic ulcer.  Continues to have epigastric pain after meal.  she also has tenderness more prominent over RUQ.  LFT consistent with obstructive etiology but improving.  MRI abdomen questionable 4 mm low signal intensity filling defect in the lower third  of CBD raising concern for sludge or tiny stone.  -GI following-planning for EGD 2/8. -Continue PPI and GI cocktail -Clear liquid diet and n.p.o. after midnight -Continue trending LFT -On ceftriaxone for possible pyelonephritis.  Elevated liver enzymes/hyperbilirubinemia/alkaline phosphatase: Improved.  Likely due to the above.  Acute hepatitis panel, HIV and COVID-19 negative.  CRP within normal.  ESR mildly elevated. -GI following-plan as above.  History of autoimmune hepatitis:  -Consider autoimmune work-up if no improvement in LFT.  Acute  pyelonephritis: patient without UTI symptoms, fever, chills or CVA tenderness.  However, CT and MRI finding concerning for acute pyelonephritis with possible early abscess.  Admitting MD discussed with IR who did not appreciate anything drainable but recommended outpatient follow-up and possibly multiphase CT of the abdomen.  I would favor treating as pyelonephritis vs wait and watch.  Blood cultures negative.  Urine culture was not sent. -Ceftriaxone 2/6> -Asked RN to send urine culture -May need repeat CT outpatient after treatment course.  Tobacco use disorder: -Encourage cessation -Declined nicotine patch                DVT prophylaxis: Subcu Lovenox Code Status: Full code Family Communication: Patient and/or RN. Available if any question.   Discharge barrier: Abdominal pain, elevated liver enzymes and acute pyelonephritis Patient is from: Home Final disposition: Likely home once medically stable and cleared by GI  Consultants: Gastroenterology   Microbiology summarized: Influenza PCR negative COVID-19 PCR negative Blood cultures negative so far Urine culture pending  Sch Meds:  Scheduled Meds: . enoxaparin (LOVENOX) injection  40 mg Subcutaneous Q24H  . pantoprazole  40 mg Oral Daily  . sodium chloride flush  3 mL Intravenous Once  . sodium chloride flush  3 mL Intravenous Q12H   Continuous Infusions: . sodium chloride 100 mL/hr at 08/21/19 1212  . cefTRIAXone (ROCEPHIN)  IV 2 g (08/21/19 1022)   PRN Meds:.albuterol, alum & mag hydroxide-simeth **AND** [COMPLETED] lidocaine, morphine injection, ondansetron **OR** ondansetron (ZOFRAN) IV  Antimicrobials: Anti-infectives (From admission, onward)   Start     Dose/Rate Route Frequency Ordered Stop   08/20/19 1100  cefTRIAXone (ROCEPHIN) 2 g in sodium chloride 0.9 % 100 mL IVPB     2 g 200 mL/hr over 30 Minutes Intravenous Every 24 hours 08/20/19 1053         I have personally reviewed the following labs and  images: CBC: Recent Labs  Lab 08/18/19 1118 08/19/19 0129 08/20/19 0751 08/21/19 0245  WBC 11.2* 10.3 9.6 9.1  HGB 12.7 12.6 11.5* 11.0*  HCT 38.2 38.0 34.1* 32.7*  MCV 93.9 93.6 91.9 91.6  PLT 356 369 325 281   BMP &GFR Recent Labs  Lab 08/18/19 1118 08/19/19 0129 08/20/19 0751 08/21/19 0245  NA 138 140 137 137  K 4.1 3.7 3.3* 4.2  CL 102 101 109 110  CO2 _0 20*  GLUCOSE 100* 100* 102* 84  BUN _1 <5*  CREATININE 0.78 0.86 0.77 0.63  CALCIUM 9.3 9.3 8.5* 8.5*  MG  --   --   --  1.7  PHOS  --   --   --  2.5   Estimated Creatinine Clearance: 75.8 mL/min (by C-G formula based on SCr of 0.63 mg/dL). Liver & Pancreas: Recent Labs  Lab 08/18/19 1118 08/19/19 0129 08/20/19 0751 08/21/19 0245  AST 333* 417* 114* 56*  ALT 280* 514* 330* 245*  ALKPHOS 293* 355* 341* 300*  BILITOT 1.2 3.7* 1.1 0.7  PROT 6.5 6.7  5.6* 5.3*  ALBUMIN 3.9 3.8 3.0* 2.9*   Recent Labs  Lab 08/18/19 1118 08/19/19 0129 08/21/19 0245  LIPASE _0 No results for input(s): AMMONIA in the last 168 hours. Diabetic: No results for input(s): HGBA1C in the last 72 hours. No results for input(s): GLUCAP in the last 168 hours. Cardiac Enzymes: No results for input(s): CKTOTAL, CKMB, CKMBINDEX, TROPONINI in the last 168 hours. No results for input(s): PROBNP in the last 8760 hours. Coagulation Profile: Recent Labs  Lab 08/19/19 1411  INR 1.0   Thyroid Function Tests: Recent Labs    08/19/19 1411  TSH 2.621   Lipid Profile: No results for input(s): CHOL, HDL, LDLCALC, TRIG, CHOLHDL, LDLDIRECT in the last 72 hours. Anemia Panel: No results for input(s): VITAMINB12, FOLATE, FERRITIN, TIBC, IRON, RETICCTPCT in the last 72 hours. Urine analysis:    Component Value Date/Time   COLORURINE AMBER (A) 08/19/2019 0108   APPEARANCEUR CLOUDY (A) 08/19/2019 0108   LABSPEC 1.021 08/19/2019 0108   PHURINE 6.0 08/19/2019 0108   GLUCOSEU NEGATIVE 08/19/2019 0108   HGBUR NEGATIVE  08/19/2019 0108   BILIRUBINUR SMALL (A) 08/19/2019 0108   KETONESUR 20 (A) 08/19/2019 0108   PROTEINUR NEGATIVE 08/19/2019 0108   NITRITE NEGATIVE 08/19/2019 0108   LEUKOCYTESUR NEGATIVE 08/19/2019 0108   Sepsis Labs: Invalid input(s): PROCALCITONIN, [place]  Microbiology: Recent Results (from the past 240 hour(s))  SARS CORONAVIRUS 2 (TAT 6-24 HRS) Nasopharyngeal Nasopharyngeal Swab     Status: None   Collection Time: 08/19/19  7:43 AM   Specimen: Nasopharyngeal Swab  Result Value Ref Range Status   SARS Coronavirus 2 NEGATIVE NEGATIVE Final    Comment: (NOTE) SARS-CoV-2 target nucleic acids are NOT DETECTED. The SARS-CoV-2 RNA is generally detectable in upper and lower respiratory specimens during the acute phase of infection. Negative results do not preclude SARS-CoV-2 infection, do not rule out co-infections with other pathogens, and should not be used as the sole basis for treatment or other patient management decisions. Negative results must be combined with clinical observations, patient history, and epidemiological information. The expected result is Negative. Fact Sheet for Patients: SugarRoll.be Fact Sheet for Healthcare Providers: https://www.woods-mathews.com/ This test is not yet approved or cleared by the [place] FDA and  has been authorized for detection and/or diagnosis of SARS-CoV-2 by FDA under an Emergency Use Authorization (EUA). This EUA will remain  in effect (meaning this test can be used) for the duration of the COVID-19 declaration under Section 56 4(b)(1) of the Act, 21 U.S.C. section 360bbb-3(b)(1), unless the authorization is terminated or revoked sooner. Performed at [facility], [place] [address], [place], [place] [address]   Culture, blood (routine x 2)     Status: None (Preliminary result)   Collection Time: 08/19/19  2:10 PM   Specimen: BLOOD RIGHT ARM  Result Value Ref Range Status    Specimen Description BLOOD RIGHT ARM  Final   Special Requests   Final    BOTTLES DRAWN AEROBIC AND ANAEROBIC Blood Culture adequate volume   Culture   Final    NO GROWTH 2 DAYS Performed at [facility], [place] [address] [place], [place] [address]    Report Status PENDING  Incomplete  Culture, blood (routine x 2)     Status: None (Preliminary result)   Collection Time: 08/19/19  2:15 PM   Specimen: BLOOD RIGHT ARM  Result Value Ref Range Status   Specimen Description BLOOD RIGHT ARM  Final  Special Requests   Final    BOTTLES DRAWN AEROBIC AND ANAEROBIC Blood Culture adequate volume   Culture   Final    NO GROWTH 2 DAYS Performed at [facility], [place] [address] [place], [place] [address]    Report Status PENDING  Incomplete    Radiology Studies: No results found.    Oluwakemi Salsberry T. [place]  If 7PM-7AM, please contact night-coverage [email or web address] Password [facility] 08/21/2019, 12:19 PM

## 2019-08-21 NOTE — Progress Notes (Signed)
[facility] Gastroenterology Progress Note  Katrina Lewis 44 y.o. 1976-04-13   Subjective: Continues to have epigastric pain that is worse after eating. She is scared to eat. Denies N/V.  Objective: Vital signs: Vitals:   08/21/19 0543 08/21/19 0904  BP: 115/78 122/71  Pulse: 63 68  Resp: 15 19  Temp: 98.5 F (36.9 C) 99 F (37.2 C)  SpO2: 97% 100%    Physical Exam: Gen: alert, no acute distress, thin, pleasant HEENT: anicteric sclera CV: RRR Chest: CTA B Abd: epigastric tenderness with guarding otherwise nontender, soft, nondistended, +BS Ext: no edema  Lab Results: Recent Labs    08/20/19 0751 08/21/19 0245  NA 137 137  K 3.3* 4.2  CL 109 110  CO2 24 20*  GLUCOSE 102* 84  BUN 9 <5*  CREATININE 0.77 0.63  CALCIUM 8.5* 8.5*  MG  --  1.7  PHOS  --  2.5   Recent Labs    08/20/19 0751 08/21/19 0245  AST 114* 56*  ALT 330* 245*  ALKPHOS 341* 300*  BILITOT 1.1 0.7  PROT 5.6* 5.3*  ALBUMIN 3.0* 2.9*   Recent Labs    08/20/19 0751 08/21/19 0245  WBC 9.6 9.1  HGB 11.5* 11.0*  HCT 34.1* 32.7*  MCV 91.9 91.6  PLT 325 281      Assessment/Plan: Postprandial epigastric pain in the setting of elevated LFTs that are improving. Question of right-sided pyelonephritis on IV Abx. History of autoimmune hepatitis (see consult note for details). Due to ongoing abdominal pain will do an EGD to check for peptic ulcer disease. D/C soft food and changed to clear liquid diet. NPO p MN for EGD by Dr. Cristina Lewis tomorrow morning.   Katrina Lewis 08/21/2019, 9:20 AM  Questions please call [phone] ID: Katrina Lewis, female   DOB: [birth year], 44 y.o.   MRN: [id number]

## 2019-08-22 ENCOUNTER — Encounter (HOSPITAL_COMMUNITY): Payer: Self-pay | Admitting: Internal Medicine

## 2019-08-22 ENCOUNTER — Encounter (HOSPITAL_COMMUNITY)
Admission: EM | Disposition: A | Discharge status: Home / Self Care | Payer: Self-pay | Source: Home / Self Care | Attending: Student

## 2019-08-22 ENCOUNTER — Inpatient Hospital Stay (HOSPITAL_COMMUNITY): Payer: 59 | Admitting: Anesthesiology

## 2019-08-22 DIAGNOSIS — K219 Gastro-esophageal reflux disease without esophagitis: Secondary | ICD-10-CM

## 2019-08-22 HISTORY — PX: ESOPHAGOGASTRODUODENOSCOPY (EGD) WITH PROPOFOL: SHX5813

## 2019-08-22 HISTORY — PX: BIOPSY: SHX5522

## 2019-08-22 LAB — HEPATIC FUNCTION PANEL
ALT: 200 U/L — ABNORMAL HIGH (ref 0–44)
AST: 43 U/L — ABNORMAL HIGH (ref 15–41)
Albumin: 2.8 g/dL — ABNORMAL LOW (ref 3.5–5.0)
Alkaline Phosphatase: 322 U/L — ABNORMAL HIGH (ref 38–126)
Bilirubin, Direct: 0.2 mg/dL (ref 0.0–0.2)
Indirect Bilirubin: 0.5 mg/dL (ref 0.3–0.9)
Total Bilirubin: 0.7 mg/dL (ref 0.3–1.2)
Total Protein: 5.5 g/dL — ABNORMAL LOW (ref 6.5–8.1)

## 2019-08-22 LAB — CBC
HCT: 34.6 % — ABNORMAL LOW (ref 36.0–46.0)
Hemoglobin: 11.7 g/dL — ABNORMAL LOW (ref 12.0–15.0)
MCH: 31 pg (ref 26.0–34.0)
MCHC: 33.8 g/dL (ref 30.0–36.0)
MCV: 91.5 fL (ref 80.0–100.0)
Platelets: 320 10*3/uL (ref 150–400)
RBC: 3.78 MIL/uL — ABNORMAL LOW (ref 3.87–5.11)
RDW: 13.2 % (ref 11.5–15.5)
WBC: 8.9 10*3/uL (ref 4.0–10.5)
nRBC: 0 % (ref 0.0–0.2)

## 2019-08-22 LAB — RENAL FUNCTION PANEL
Albumin: 2.9 g/dL — ABNORMAL LOW (ref 3.5–5.0)
Anion gap: 9 (ref 5–15)
BUN: 5 mg/dL — ABNORMAL LOW (ref 6–20)
CO2: 22 mmol/L (ref 22–32)
Calcium: 8.7 mg/dL — ABNORMAL LOW (ref 8.9–10.3)
Chloride: 110 mmol/L (ref 98–111)
Creatinine, Ser: 0.67 mg/dL (ref 0.44–1.00)
GFR calc Af Amer: >60 mL/min (ref >60)
GFR calc non Af Amer: >60 mL/min (ref >60)
Glucose, Bld: 90 mg/dL (ref 70–99)
Phosphorus: 3.1 mg/dL (ref 2.5–4.6)
Potassium: 3.7 mmol/L (ref 3.5–5.1)
Sodium: 141 mmol/L (ref 135–145)

## 2019-08-22 LAB — URINE CULTURE: Culture: NO GROWTH

## 2019-08-22 LAB — MAGNESIUM: Magnesium: 1.7 mg/dL (ref 1.7–2.4)

## 2019-08-22 SURGERY — ESOPHAGOGASTRODUODENOSCOPY (EGD) WITH PROPOFOL
Anesthesia: Monitor Anesthesia Care

## 2019-08-22 MED ORDER — PROPOFOL 10 MG/ML IV BOLUS
INTRAVENOUS | Status: DC | PRN
  Administered 2019-08-22: 30 mg via INTRAVENOUS
  Administered 2019-08-22: 20 mg via INTRAVENOUS
  Administered 2019-08-22: 50 mg via INTRAVENOUS

## 2019-08-22 MED ORDER — PROPOFOL 500 MG/50ML IV EMUL
INTRAVENOUS | Status: DC | PRN
  Administered 2019-08-22: 100 ug/kg/min via INTRAVENOUS

## 2019-08-22 MED ORDER — LIDOCAINE 2% (20 MG/ML) 5 ML SYRINGE
INTRAMUSCULAR | Status: DC | PRN
  Administered 2019-08-22: 60 mg via INTRAVENOUS

## 2019-08-22 MED ORDER — PANTOPRAZOLE SODIUM 40 MG PO TBEC: 40.0000 mg | DELAYED_RELEASE_TABLET | Freq: Every day | ORAL | 0 refills | Status: AC

## 2019-08-22 MED ORDER — LEVOFLOXACIN 750 MG PO TABS: 750.0000 mg | ORAL_TABLET | Freq: Every day | ORAL | 0 refills | Status: AC

## 2019-08-22 MED ORDER — LACTATED RINGERS IV SOLN: INTRAVENOUS | Status: DC

## 2019-08-22 MED FILL — PANTOPRAZOLE SOD DR 40 MG T: 40 | 30 days supply | Qty: 30 | Fill #0

## 2019-08-22 MED FILL — levoFLOXacin 750 MG TABS: 750 | 7 days supply | Qty: 7 | Fill #0

## 2019-08-22 SURGICAL SUPPLY — 15 items

## 2019-08-22 NOTE — Anesthesia Procedure Notes (Signed)
Procedure Name: MAC Date/Time: 08/22/2019 9:11 AM Performed by: Barrington Ellison, CRNA Pre-anesthesia Checklist: Patient identified, Emergency Drugs available, Suction available, Patient being monitored and Timeout performed Patient Re-evaluated:Patient Re-evaluated prior to induction Oxygen Delivery Method: Nasal cannula

## 2019-08-22 NOTE — Op Note (Signed)
[facility] Patient Name: Katrina Lewis Procedure Date : 08/22/2019 MRN: [id number] Attending MD: Ronald Lobo , MD Date of Birth: [birth year] CSN: [id number] Age: 44 Admit Type: Inpatient Procedure:                Upper GI endoscopy Indications:              Epigastric abdominal pain Providers:                Ronald Lobo, MD, Jeanella Cara, RN,                            Corie Chiquito, Technician, Lazaro Arms, Technician,                            Curlene Labrum, CRNA Referring MD:              Medicines:                Monitored Anesthesia Care Complications:            No immediate complications. Estimated Blood Loss:     Estimated blood loss was minimal. Procedure:                Pre-Anesthesia Assessment:                           - Prior to the procedure, a History and Physical                            was performed, and patient medications and                            allergies were reviewed. The patient's tolerance of                            previous anesthesia was also reviewed. The risks                            and benefits of the procedure and the sedation                            options and risks were discussed with the patient.                            All questions were answered, and informed consent                            was obtained. Prior Anticoagulants: The patient has                            taken no previous anticoagulant or antiplatelet                            agents. ASA Grade Assessment: III - A patient with  severe systemic disease. After reviewing the risks                            and benefits, the patient was deemed in                            satisfactory condition to undergo the procedure.                           After obtaining informed consent, the endoscope was                            passed under direct vision. Throughout the                            procedure,  the patient's blood pressure, pulse, and                            oxygen saturations were monitored continuously. The                            [id number]-[id number] Olympus gastroscope was                            introduced through the mouth, and advanced to the                            second part of duodenum. The upper GI endoscopy was                            accomplished without difficulty. The patient                            tolerated the procedure well. Scope In: Scope Out: Findings:      The examined esophagus was normal.      There is no endoscopic evidence of varices in the entire esophagus.      The entire examined stomach was normal. Biopsies were taken with a cold       forceps for histology. Estimated blood loss was minimal.      The cardia and gastric fundus were normal on retroflexion.      The examined duodenum was normal. Biopsies were taken with a cold       forceps for histology. Impression:               - Normal esophagus.                           - Normal stomach. Biopsied.                           - Normal examined duodenum. Biopsied.                           - No source of recent epigastric pain  endoscopically evident. Recommendation:           - Continue present medications.                           - Await pathology results.                           - Advance diet. Procedure Code(s):        --- Professional ---                           [id number], Esophagogastroduodenoscopy, flexible,                            transoral; with biopsy, single or multiple Diagnosis Code(s):        --- Professional ---                           R10.13, Epigastric pain CPT copyright 2019 [employer]. All rights reserved. The codes documented in this report are preliminary and upon coder review may  be revised to meet current compliance requirements. Ronald Lobo, MD 08/22/2019 9:39:52 AM This report has been signed  electronically. Number of Addenda: 0

## 2019-08-22 NOTE — Progress Notes (Signed)
44 year old female with longstanding history of nonspecific autoimmune liver disease developed the abrupt onset of epigastric severe pain last Thursday.  It has been better over the past 24 hours, while under treatment for radiographically diagnosed right-sided pyelonephritis (no real urinary symptoms or flank pain to support that diagnosis).  Her MR I showed evidence of a possible very small common bile duct stone, but without biliary ductal dilatation.  The patient does have significantly elevated liver chemistries, improving during hospitalization.  The patient indicates that she has not been seen at [facility] (Dr. Drue Novel) for the past several years because they were in the process of tapering off her steroids and azathioprine.  She has not had liver chemistries checked since then, until this hospitalization.  Therefore, it is not clear whether her elevated liver chemistries are a reflection of recurrent activity of her autoimmune liver disease, reactive hepatopathy from infection, or biliary obstruction from a bile duct stone.  At this time, the patient indicates she is hungry and feels ready to eat.  Her abdominal pain is essentially totally resolved.  On exam, there is no tenderness and she certainly looks fine.  Our plan is for endoscopic evaluation, probably with random mucosal biopsies.  Petra Kuba, purpose, and risks reviewed and patient agreeable.  Current plan is to try to advance patient's diet while awaiting biopsy results.  If her pain recurs, consider endoscopic ultrasound to more definitively confirm the presence or absence of a common bile duct stone.  In any event, the need for hepatic reevaluation was emphasized, because of the potential for untreated autoimmune hepatitis to progress to long-term hepatic injury.  Cleotis Nipper, M.D. Pager [phone] If no answer or after 5 PM call [phone]

## 2019-08-22 NOTE — Progress Notes (Addendum)
Patient's endoscopy is unrevealing for source of abdominal pain.  It was completely normal.  Random biopsies are pending.  Meanwhile, thankfully, the patient's abdominal pain is essentially resolved.  The reason for this is not clear, perhaps a therapeutic effect of her antibiotics for the questionable pyelonephritis noted on MRI scan of the abdomen, without obvious correlative clinical symptoms.  Recommendations:  1.  We will advance to soft diet 2.  Await biopsies, which I anticipate will be normal 3.  If patient tolerates diet, patient could potentially go home tomorrow, with need for outpatient attention to her elevated liver chemistries and possible common bile duct stone 4.  If the patient has recurrent pain, I would favor an endoscopic ultrasound as our next step, to try to clarify whether a common duct stone is truly present, as was suggested as a possibility on her recent MRCP 5.  For now, would continue PPI therapy.  Perhaps her epigastric pain was an atypical manifestation of GERD.  However, she clearly does not have peptic ulcer disease, based on the endoscopic findings.  Cleotis Nipper, M.D. Pager [phone] If no answer or after 5 PM call [phone]  ADDENDUM:  Pt is hoping she might be able to go home this evening if she tolerates her diet.  That would be ok from my standpoint (not sure how long she needs inpt antbx for ?pyelonepritis).  (I do not think it is obligatory to keep her in-house, from GI standpoint, if doing ok today with meals.)  If she does go home, we will contact her to make appropriate GI f/u arrangements.  Please feel free to call me with any questions.

## 2019-08-22 NOTE — Anesthesia Postprocedure Evaluation (Signed)
Anesthesia Post Note  Patient: Katrina Lewis  Procedure(s) Performed: ESOPHAGOGASTRODUODENOSCOPY (EGD) WITH PROPOFOL (N/A ) BIOPSY     Patient location during evaluation: PACU Anesthesia Type: MAC Level of consciousness: awake and alert Pain management: pain level controlled Vital Signs Assessment: post-procedure vital signs reviewed and stable Respiratory status: spontaneous breathing, nonlabored ventilation, respiratory function stable and patient connected to nasal cannula oxygen Cardiovascular status: stable and blood pressure returned to baseline Postop Assessment: no apparent nausea or vomiting Anesthetic complications: no    Last Vitals:  Vitals:   08/22/19 0808 08/22/19 0938  BP: (!) 147/76 129/74  Pulse: 72 70  Resp: 16 18  Temp: 36.9 C 36.9 C  SpO2: 100% 100%    Last Pain:  Vitals:   08/22/19 0938  TempSrc: Oral  PainSc: 0-No pain                 Citlalli Weikel S

## 2019-08-22 NOTE — Anesthesia Preprocedure Evaluation (Signed)
Anesthesia Evaluation  Patient identified by MRN, date of birth, ID band Patient awake    Reviewed: Allergy & Precautions, NPO status , Patient's Chart, lab work & pertinent test results  Airway Mallampati: II  TM Distance: >3 FB Neck ROM: Full    Dental no notable dental hx.    Pulmonary Current Smoker,    Pulmonary exam normal breath sounds clear to auscultation       Cardiovascular negative cardio ROS Normal cardiovascular exam Rhythm:Regular Rate:Normal     Neuro/Psych negative neurological ROS  negative psych ROS   GI/Hepatic negative GI ROS, (+) Hepatitis -  Endo/Other  negative endocrine ROS  Renal/GU negative Renal ROS  negative genitourinary   Musculoskeletal negative musculoskeletal ROS (+)   Abdominal   Peds negative pediatric ROS (+)  Hematology negative hematology ROS (+)   Anesthesia Other Findings   Reproductive/Obstetrics negative OB ROS                             Anesthesia Physical Anesthesia Plan  ASA: III  Anesthesia Plan: MAC   Post-op Pain Management:    Induction: Intravenous  PONV Risk Score and Plan:   Airway Management Planned:   Additional Equipment:   Intra-op Plan:   Post-operative Plan:   Informed Consent: I have reviewed the patients History and Physical, chart, labs and discussed the procedure including the risks, benefits and alternatives for the proposed anesthesia with the patient or authorized representative who has indicated his/her understanding and acceptance.     Dental advisory given  Plan Discussed with: CRNA and Surgeon  Anesthesia Plan Comments:         Anesthesia Quick Evaluation

## 2019-08-22 NOTE — Interval H&P Note (Signed)
History and Physical Interval Note:  08/22/2019 8:52 AM  Katrina Lewis  has presented today for surgery, with the diagnosis of Epigastric pain.  The various methods of treatment have been discussed with the patient. After consideration of risks, benefits and other options for treatment, the patient has consented to  Procedure(s): ESOPHAGOGASTRODUODENOSCOPY (EGD) WITH PROPOFOL (N/A) as a surgical intervention.  The patient's history has been reviewed, patient examined, no change in status, stable for surgery.  I have reviewed the patient's chart and labs.  Questions were answered to the patient's satisfaction.     Youlanda Mighty Edu On

## 2019-08-22 NOTE — Progress Notes (Signed)
RN gave pt discharge instructions and pt stated understanding,she was able to tolerate her breakfast and lunch good. IV has been removed pt waiting for her ride, medications had been dropped off by [place]

## 2019-08-22 NOTE — Transfer of Care (Signed)
Immediate Anesthesia Transfer of Care Note  Patient: Katrina Lewis  Procedure(s) Performed: ESOPHAGOGASTRODUODENOSCOPY (EGD) WITH PROPOFOL (N/A ) BIOPSY  Patient Location: Endoscopy Unit  Anesthesia Type:MAC  Level of Consciousness: awake, alert , oriented and patient cooperative  Airway & Oxygen Therapy: Patient Spontanous Breathing  Post-op Assessment: Report given to RN and Post -op Vital signs reviewed and stable  Post vital signs: Reviewed and stable  Last Vitals:  Vitals Value Taken Time  BP 129/74 08/22/19 0938  Temp 36.9 C 08/22/19 0938  Pulse 65 08/22/19 0938  Resp 18 08/22/19 0938  SpO2 98 % 08/22/19 0938  Vitals shown include unvalidated device data.  Last Pain:  Vitals:   08/22/19 0938  TempSrc: Oral  PainSc: 0-No pain         Complications: No apparent anesthesia complications

## 2019-08-22 NOTE — Progress Notes (Signed)
Per Dr. Cyndia Skeeters, pt did well w/ lunch and wants to go home.  OK from our standpoint--other things being equal, I would probably have wanted to wait until after dinner or even til tomorrow, out of an abundance of caution, but given pt's preference, I think it's ok and medically reasonable for her to go home at any time.  Our office will contact pt to arrange f/u appt.  Cleotis Nipper, M.D. Pager [phone] If no answer or after 5 PM call [phone]

## 2019-08-22 NOTE — Discharge Summary (Signed)
Physician Discharge Summary  Katrina Lewis HYI:[id number] DOB: 09/02/1975 DOA: 08/19/2019  PCP: System, Pcp Not In  Admit date: 08/19/2019 Discharge date: 08/22/2019  Admitted From: Home Disposition: Home  Recommendations for Outpatient Follow-up:  1. Follow up with PCP in 1 to 2 weeks 2. Please obtain CBC/CMP at follow up 3. Lumbar GI to arrange outpatient follow-up 4. Please follow up on the following pending results: Gastric biopsy  Home Health: None Equipment/Devices: None  Discharge Condition: Stable CODE STATUS: Full code  Hospital Course: 44 year old female with history of autoimmune hepatitis thought to be due to OCP presenting with epigastric pain, sense of bloating, nausea and fatigue for 1 day.  Evaluated in ED and discharged home but had to return to daycare severe epigastric pain that woke her up from sleep at midnight.  Has not seen his GI doctor at [facility] in the last 3 years.   In ED, hemodynamically stable.  WBC 10.3.  AST 417.  ALT 514.  Total bili 3.7.  COVID-19 PCR negative.  Urinalysis not impressive.  CT abdomen and pelvis concerning for focal pyelonephritis of upper pole of right kidney with possible early abscess, mild intra and extrahepatic biliary dilation and mild inflammatory changes of the right hepatic lobe adjacent to the right kidney.  MRI abdomen ordered and she was admitted.  MRI abdomen also concerning for acute pyelonephritis of right kidney with ill-defined 1.6 x 1.6 cm focus of phlegmonous change, 2.2 x 1.4 cm cystic focus of right kidney, and questionable 4 mm low signal intensity filling defect in the lower third of CBD raising concern for sludge or tiny stone.    IR consulted by admitting provider about right kidney phlegmonous change/possible early abscess but "did not appreciate anything significant to drain, I recommended multiphase CT of the abdomen".   GI consulted and she had normal EGD on 08/22/19.  Gastric biopsy pending.  Initially GI  plan was to observe overnight after EGD, and discharge in the morning.  However, patient eager to go home after she tolerated soft diet after EGD. GI okay to honor her wish.   In regards to possible right pyelonephritis, she was started on ceftriaxone.  Urine culture negative.  She was discharged on Levaquin for 7 more days.  She may need repeat CT in 3 to 4 weeks to assess response to antibiotics.  See individual problem list below for more hospital course. Discharge Diagnoses:  Epigastric/RUQ/RLQ pain: her symptoms and exam concerning for biliary process  or GERD.  -EGD on 2/8 basically normal.  Pathology pending.  HIV and acute hepatitis panel negative -Abdominal pain and tenderness improved.  She tolerated soft diet.  Liver enzymes improved as well. -Discharged on Protonix 40 mg daily for 90 days. -GI to arrange outpatient follow-up.  Elevated liver enzymes/hyperbilirubinemia/alkaline phosphatase: Improved.  Likely due to the above.  Acute hepatitis panel, HIV and COVID-19 negative.  CRP within normal.  ESR mildly elevated. -GI to arrange outpatient follow-up.  History of autoimmune hepatitis:  -Consider autoimmune work-up if no improvement in LFT.  Acute pyelonephritis: patient without UTI symptoms, fever, chills or CVA tenderness.  However, CT and MRI finding concerning for acute pyelonephritis with possible early abscess.  Admitting MD discussed with IR who did not appreciate anything drainable but recommended outpatient follow-up and possibly multiphase CT of the abdomen.  I would favor treating as pyelonephritis vs wait and watch.    Blood and urine culture negative. -Ceftriaxone 2/6> 2/8.  Levaquin 2/9-2/16 -Repeat CT or MRI  in 3 to 4 weeks to assess response to antibiotics.  Tobacco use disorder: -Encourage cessation -Declined nicotine patch   Discharge Instructions  Discharge Instructions    Call MD for:  extreme fatigue   Complete by: As directed    Call MD for:   persistant dizziness or light-headedness   Complete by: As directed    Call MD for:  persistant nausea and vomiting   Complete by: As directed    Call MD for:  severe uncontrolled pain   Complete by: As directed    Call MD for:  temperature >100.4   Complete by: As directed    Diet general   Complete by: As directed    Discharge instructions   Complete by: As directed    It has been a pleasure taking care of you! You were hospitalized for abdominal pain.  It is unclear what caused your abdominal pain but concern that it could be due to small stone in the bile duct or reflux.  Your endoscopy did not reveal anything major.  You pain improved to the point we think it is safe to let you go home.  We are discharging you on Protonix for heartburn.    There was also concern about right kidney infection.  We started you on antibiotics, and discharging you more antibiotics to complete treatment course.  You may need repeat CT in the future to ensure the finding concerning for kidney infection has resolved.  Please review your new medication list and the directions before you take your medications.  Please follow-up with your primary care doctor or establish care with one as soon as possible.  The gastroenterologist office will call you to arrange outpatient follow-up in 1 to 2 weeks. Take care,   Increase activity slowly   Complete by: As directed      Allergies as of 08/22/2019   No Known Allergies     Medication List    STOP taking these medications   HYDROmorphone 2 MG tablet Commonly known as: Dilaudid   ibuprofen 600 MG tablet Commonly known as: ADVIL     TAKE these medications   amphetamine-dextroamphetamine 20 MG tablet Commonly known as: ADDERALL Take 20 mg by mouth 2 (two) times daily.   levofloxacin 750 MG tablet Commonly known as: Levaquin Take 1 tablet (750 mg total) by mouth daily for 7 days.   pantoprazole 40 MG tablet Commonly known as: Protonix Take 1 tablet (40  mg total) by mouth daily.   Wellbutrin XL 150 MG 24 hr tablet Generic drug: buPROPion Take 150 mg by mouth daily.       Consultations:  Gastroenterology  Procedures/Studies:  2/8-EGD basically normal.   MR Abdomen W or Wo Contrast  Result Date: 08/19/2019 CLINICAL DATA:  Focal pyelonephritis suspected in the upper right kidney on CT study from earlier today. Concern for early abscess. Abdominal pain. EXAM: MRI ABDOMEN WITHOUT AND WITH CONTRAST TECHNIQUE: Multiplanar multisequence MR imaging of the abdomen was performed both before and after the administration of intravenous contrast. CONTRAST:  96m GADAVIST GADOBUTROL 1 MMOL/ML IV SOLN COMPARISON:  08/19/2019 CT abdomen/pelvis. FINDINGS: Lower chest: No acute abnormality at the lung bases. Hepatobiliary: Normal liver size and configuration. No significant hepatic steatosis. There is a 0.6 cm liver hemangioma in posterior segment 6 right liver lobe (series 4/image 26) with mild T2 hyperintensity and progressive enhancement, stable since 01/28/2010 MRI. No additional liver lesions. Normal gallbladder with no cholelithiasis. No biliary ductal dilatation. Common bile duct diameter  5 mm. There is a questionable small 4 mm low signal intensity filling defect in the lower third of the common bile duct (series 2/image 20). No biliary strictures, enhancing masses or beading. Generalized mild periportal edema. Pancreas: No pancreatic mass or duct dilation.  No pancreas divisum. Spleen: Normal size. No mass. Adrenals/Urinary Tract: Normal adrenals. There is diffuse parenchymal edema/thickening and heterogeneous hypoenhancement throughout the upper 2/3 of the right renal parenchyma, most compatible with acute pyelonephritis. There is a 2.2 x 1.4 cm cystic focus in the central upper right kidney (series 704/image 90) without restricted diffusion, suspected to represent upper pole caliectasis. Remaining right renal collecting system is normal caliber. There is  a 1.6 x 1.6 cm ill-defined focus of phlegmonous change/early abscess in the posterior interpolar right kidney (series 704/image 109). Normal left kidney with no left renal masses. No left hydronephrosis. Stomach/Bowel: Normal non-distended stomach. Visualized small and large bowel is normal caliber, with no bowel wall thickening. Vascular/Lymphatic: Normal caliber abdominal aorta. Patent portal, splenic, hepatic and renal veins. No pathologically enlarged lymph nodes in the abdomen. Other: No abdominal ascites or focal fluid collection. Musculoskeletal: No aggressive appearing focal osseous lesions. IMPRESSION: 1. Diffuse parenchymal edema/thickening and hypoenhancement throughout the upper 2/3 of the right kidney, most compatible with acute pyelonephritis. 2. Ill-defined 1.6 x 1.6 cm focus of phlegmonous change/early abscess formation in the posterior interpolar right kidney. 3. Central upper right renal 2.2 x 1.4 cm cystic focus, suspected to represent upper pole caliectasis. 4. Questionable 4 mm low signal intensity filling defect in the lower third of the common bile duct, cannot exclude sludge or tiny stone. No biliary ductal dilatation. CBD diameter 5 mm. Suggest correlation with serum bilirubin levels. 5. Suggest short-term follow-up MRI abdomen with MRCP without and with IV contrast after a course of antibiotic therapy. Electronically Signed   By: Ilona Sorrel M.D.   On: 08/19/2019 10:03   CT ABDOMEN PELVIS W CONTRAST  Result Date: 08/19/2019 CLINICAL DATA:  Abdominal pain. EXAM: CT ABDOMEN AND PELVIS WITH CONTRAST TECHNIQUE: Multidetector CT imaging of the abdomen and pelvis was performed using the standard protocol following bolus administration of intravenous contrast. CONTRAST:  133m OMNIPAQUE IOHEXOL 300 MG/ML  SOLN COMPARISON:  CT scan 12/16/2009 FINDINGS: Lower chest: The lung bases are clear of acute process. No pleural effusion or pulmonary lesions. The heart is normal in size. No pericardial  effusion. The distal esophagus and aorta are unremarkable. Hepatobiliary: No focal hepatic lesions. SPECT mild inflammatory changes involving segment 7 of the liver adjacent to the kidney with some slight biliary dilatation. This may be related to the inflammatory process involving the right kidney affecting the adjacent liver. The gallbladder is normal. Mild intra and extrahepatic biliary dilatation for age possibly related to patient's history of autoimmune liver disease. Pancreas: No mass, inflammation or ductal dilatation. Spleen: Normal size. No focal lesions. Adrenals/Urinary Tract: Adrenal glands are normal. The left kidney is normal. The right kidney demonstrates decreased perfusion involving the entire upper pole region with areas of low attenuation. There is surrounding inflammation and findings are most consistent with lobar nephronia/focal pyelonephritis with findings suspicious for early abscess. Recommend correlation with urinalysis. The bladder is normal. Stomach/Bowel: The stomach, duodenum, small bowel and colon are grossly normal. The terminal ileum and appendix are normal. Vascular/Lymphatic: The aorta is normal in caliber. No dissection. The branch vessels are patent. The major venous structures are patent. No mesenteric or retroperitoneal mass or adenopathy. Small scattered lymph nodes are noted. Reproductive: The  uterus and ovaries are unremarkable. Corpus luteum cyst noted on the right. Very prominent left-sided parametrial vessels consistent with pelvic congestion syndrome. Other: No pelvic mass or free pelvic fluid collections. Musculoskeletal: No significant bony findings. IMPRESSION: 1. CT findings consistent with lobar nephronia/focal pyelonephritis involving the upper pole region of the right kidney with possible early abscess formation. Recommend correlation with urinalysis. No renal or ureteral calculi. 2. Possible mild inflammatory process involving the right hepatic lobe adjacent to  the right kidney. 3. Mild intra and extrahepatic biliary dilatation for age possibly related to patient's history of autoimmune liver disease. 4. Prominent left-sided parametrial vessels consistent with pelvic congestion syndrome. Electronically Signed   By: Marijo Sanes M.D.   On: 08/19/2019 06:09   DG Chest Port 1 View  Result Date: 08/19/2019 CLINICAL DATA:  Epigastric pain EXAM: PORTABLE CHEST 1 VIEW COMPARISON:  None. FINDINGS: The heart size and mediastinal contours are within normal limits. Both lungs are clear. The visualized skeletal structures are unremarkable. IMPRESSION: No active disease. Electronically Signed   By: Inez Catalina M.D.   On: 08/19/2019 03:15   US ABDOMEN LIMITED RUQ  Result Date: 08/18/2019 CLINICAL DATA:  Right upper quadrant pain for 1 day, autoimmune liver disease, elevated LFTs EXAM: ULTRASOUND ABDOMEN LIMITED RIGHT UPPER QUADRANT COMPARISON:  01/10/2010 FINDINGS: Gallbladder: No gallstones or wall thickening visualized. No sonographic Murphy sign noted by sonographer. Common bile duct: Diameter: 4 mm Liver: The liver demonstrates grossly normal echotexture. Shadowing calcification right lobe liver measures up to 1.3 cm, benign appearing. No focal liver lesion. Portal vein is patent on color Doppler imaging with normal direction of blood flow towards the liver. Other: Incidental note is made of a 5 mm hyperechoic focus within the anterior cortex of the right kidney, either a small cortical calcification or small renal angiomyolipoma. This is of doubtful clinical significance. IMPRESSION: 1. Coarse benign-appearing calcification right lobe liver. 2. Small cortical calcification versus angiomyolipoma right kidney, of doubtful clinical significance. 3. Otherwise unremarkable exam. Electronically Signed   By: Randa Ngo M.D.   On: 08/18/2019 14:26       Discharge Exam: Vitals:   08/22/19 0957 08/22/19 1215  BP: 140/80 (!) 134/96  Pulse: 66 67  Resp: 18 18  Temp:   98.3 F (36.8 C)  SpO2: 99% 100%    GENERAL: No acute distress.  Appears well.  HEENT: MMM.  Vision and hearing grossly intact.  NECK: Supple.  No apparent JVD.  RESP:  No IWOB. Good air movement bilaterally. CVS:  RRR. Heart sounds normal.  ABD/GI/GU: Bowel sounds present. Soft.  Mild discomfort over RUQ MSK/EXT:  Moves extremities. No apparent deformity or edema.  SKIN: no apparent skin lesion or wound NEURO: Awake, alert and oriented appropriately.  No apparent focal neuro deficit. PSYCH: Calm. Normal affect.   The results of significant diagnostics from this hospitalization (including imaging, microbiology, ancillary and laboratory) are listed below for reference.     Microbiology: Recent Results (from the past 240 hour(s))  SARS CORONAVIRUS 2 (TAT 6-24 HRS) Nasopharyngeal Nasopharyngeal Swab     Status: None   Collection Time: 08/19/19  7:43 AM   Specimen: Nasopharyngeal Swab  Result Value Ref Range Status   SARS Coronavirus 2 NEGATIVE NEGATIVE Final    Comment: (NOTE) SARS-CoV-2 target nucleic acids are NOT DETECTED. The SARS-CoV-2 RNA is generally detectable in upper and lower respiratory specimens during the acute phase of infection. Negative results do not preclude SARS-CoV-2 infection, do not rule out co-infections  with other pathogens, and should not be used as the sole basis for treatment or other patient management decisions. Negative results must be combined with clinical observations, patient history, and epidemiological information. The expected result is Negative. Fact Sheet for Patients: SugarRoll.be Fact Sheet for Healthcare Providers: https://www.woods-mathews.com/ This test is not yet approved or cleared by the [place] FDA and  has been authorized for detection and/or diagnosis of SARS-CoV-2 by FDA under an Emergency Use Authorization (EUA). This EUA will remain  in effect (meaning this test can be used)  for the duration of the COVID-19 declaration under Section 56 4(b)(1) of the Act, 21 U.S.C. section 360bbb-3(b)(1), unless the authorization is terminated or revoked sooner. Performed at [facility], [place] [address], [place], [place] [address]   Culture, blood (routine x 2)     Status: None (Preliminary result)   Collection Time: 08/19/19  2:10 PM   Specimen: BLOOD RIGHT ARM  Result Value Ref Range Status   Specimen Description BLOOD RIGHT ARM  Final   Special Requests   Final    BOTTLES DRAWN AEROBIC AND ANAEROBIC Blood Culture adequate volume   Culture   Final    NO GROWTH 2 DAYS Performed at [facility], [place] [address], [place], [place] [address]    Report Status PENDING  Incomplete  Culture, blood (routine x 2)     Status: None (Preliminary result)   Collection Time: 08/19/19  2:15 PM   Specimen: BLOOD RIGHT ARM  Result Value Ref Range Status   Specimen Description BLOOD RIGHT ARM  Final   Special Requests   Final    BOTTLES DRAWN AEROBIC AND ANAEROBIC Blood Culture adequate volume   Culture   Final    NO GROWTH 2 DAYS Performed at [facility], [place] [address] [place], [place] [address]    Report Status PENDING  Incomplete     Labs: BNP (last 3 results) No results for input(s): BNP in the last 8760 hours. Basic Metabolic Panel: Recent Labs  Lab 08/18/19 1118 08/19/19 0129 08/20/19 0751 08/21/19 0245 08/22/19 0417  NA 138 140 137 137 141  K 4.1 3.7 3.3* 4.2 3.7  CL 102 101 109 110 110  CO2 '27 27 24 ' 20* 22  GLUCOSE 100* 100* 102* 84 90  BUN '8 8 9 ' <5* 5*  CREATININE 0.78 0.86 0.77 0.63 0.67  CALCIUM 9.3 9.3 8.5* 8.5* 8.7*  MG  --   --   --  1.7 1.7  PHOS  --   --   --  2.5 3.1   Liver Function Tests: Recent Labs  Lab 08/18/19 1118 08/19/19 0129 08/20/19 0751 08/21/19 0245 08/22/19 0417  AST 333* 417* 114* 56* 43*  ALT 280* 514* 330* 245* 200*  ALKPHOS 293* 355* 341* 300* 322*  BILITOT 1.2 3.7* 1.1 0.7 0.7  PROT 6.5 6.7 5.6* 5.3*  5.5*  ALBUMIN 3.9 3.8 3.0* 2.9* 2.8*  2.9*   Recent Labs  Lab 08/18/19 1118 08/19/19 0129 08/21/19 0245  LIPASE '16 17 17   ' No results for input(s): AMMONIA in the last 168 hours. CBC: Recent Labs  Lab 08/18/19 1118 08/19/19 0129 08/20/19 0751 08/21/19 0245 08/22/19 0417  WBC 11.2* 10.3 9.6 9.1 8.9  HGB 12.7 12.6 11.5* 11.0* 11.7*  HCT 38.2 38.0 34.1* 32.7* 34.6*  MCV 93.9 93.6 91.9 91.6 91.5  PLT 356 369 325 281 320   Cardiac Enzymes: No results for input(s): CKTOTAL, CKMB, CKMBINDEX, TROPONINI in  the last 168 hours. BNP: Invalid input(s): POCBNP CBG: No results for input(s): GLUCAP in the last 168 hours. D-Dimer No results for input(s): DDIMER in the last 72 hours. Hgb A1c No results for input(s): HGBA1C in the last 72 hours. Lipid Profile No results for input(s): CHOL, HDL, LDLCALC, TRIG, CHOLHDL, LDLDIRECT in the last 72 hours. Thyroid function studies Recent Labs    08/19/19 1411  TSH 2.621   Anemia work up No results for input(s): VITAMINB12, FOLATE, FERRITIN, TIBC, IRON, RETICCTPCT in the last 72 hours. Urinalysis    Component Value Date/Time   COLORURINE AMBER (A) 08/19/2019 0108   APPEARANCEUR CLOUDY (A) 08/19/2019 0108   LABSPEC 1.021 08/19/2019 0108   PHURINE 6.0 08/19/2019 0108   GLUCOSEU NEGATIVE 08/19/2019 0108   HGBUR NEGATIVE 08/19/2019 0108   BILIRUBINUR SMALL (A) 08/19/2019 0108   KETONESUR 20 (A) 08/19/2019 0108   PROTEINUR NEGATIVE 08/19/2019 0108   NITRITE NEGATIVE 08/19/2019 0108   LEUKOCYTESUR NEGATIVE 08/19/2019 0108   Sepsis Labs Invalid input(s): PROCALCITONIN,  WBC,  LACTICIDVEN   Time coordinating discharge: 35 minutes  SIGNED:  Mercy Riding, MD  [facility] 08/22/2019, 1:28 PM  If 7PM-7AM, please contact night-coverage [email or web address] Password [email or web address]

## 2019-08-23 ENCOUNTER — Other Ambulatory Visit: Payer: Self-pay | Admitting: Physician Assistant

## 2019-08-23 LAB — SURGICAL PATHOLOGY

## 2019-08-24 LAB — CULTURE, BLOOD (ROUTINE X 2)
Culture: NO GROWTH
Culture: NO GROWTH
Special Requests: ADEQUATE
Special Requests: ADEQUATE

## 2019-10-21 ENCOUNTER — Other Ambulatory Visit: Payer: Self-pay | Admitting: Family Medicine

## 2019-10-21 ENCOUNTER — Ambulatory Visit
Admission: RE | Admit: 2019-10-21 | Discharge: 2019-10-21 | Disposition: A | Discharge status: Home / Self Care | Payer: 59 | Source: Ambulatory Visit | Attending: Family Medicine | Admitting: Family Medicine

## 2019-10-21 ENCOUNTER — Other Ambulatory Visit: Payer: Self-pay

## 2019-10-21 DIAGNOSIS — N151 Renal and perinephric abscess: Secondary | ICD-10-CM

## 2019-10-21 DIAGNOSIS — R310 Gross hematuria: Secondary | ICD-10-CM

## 2019-10-31 ENCOUNTER — Encounter (HOSPITAL_COMMUNITY): Payer: Self-pay | Admitting: Emergency Medicine

## 2019-10-31 ENCOUNTER — Emergency Department (HOSPITAL_COMMUNITY)
Admission: EM | Admit: 2019-10-31 | Discharge: 2019-10-31 | Disposition: A | Discharge status: Home / Self Care | Payer: 59 | Attending: Emergency Medicine | Admitting: Emergency Medicine

## 2019-10-31 DIAGNOSIS — Z5321 Procedure and treatment not carried out due to patient leaving prior to being seen by health care provider: Secondary | ICD-10-CM | POA: Insufficient documentation

## 2019-10-31 DIAGNOSIS — R110 Nausea: Secondary | ICD-10-CM | POA: Insufficient documentation

## 2019-10-31 LAB — COMPREHENSIVE METABOLIC PANEL
ALT: 39 U/L (ref 0–44)
AST: 25 U/L (ref 15–41)
Albumin: 3.3 g/dL — ABNORMAL LOW (ref 3.5–5.0)
Alkaline Phosphatase: 148 U/L — ABNORMAL HIGH (ref 38–126)
Anion gap: 11 (ref 5–15)
BUN: 14 mg/dL (ref 6–20)
CO2: 25 mmol/L (ref 22–32)
Calcium: 8.8 mg/dL — ABNORMAL LOW (ref 8.9–10.3)
Chloride: 101 mmol/L (ref 98–111)
Creatinine, Ser: 1.11 mg/dL — ABNORMAL HIGH (ref 0.44–1.00)
GFR calc Af Amer: >60 mL/min (ref >60)
GFR calc non Af Amer: >60 mL/min (ref >60)
Glucose, Bld: 98 mg/dL (ref 70–99)
Potassium: 3.4 mmol/L — ABNORMAL LOW (ref 3.5–5.1)
Sodium: 137 mmol/L (ref 135–145)
Total Bilirubin: 0.2 mg/dL — ABNORMAL LOW (ref 0.3–1.2)
Total Protein: 6.7 g/dL (ref 6.5–8.1)

## 2019-10-31 LAB — URINALYSIS, ROUTINE W REFLEX MICROSCOPIC
Bilirubin Urine: NEGATIVE
Glucose, UA: NEGATIVE mg/dL
Hgb urine dipstick: NEGATIVE
Ketones, ur: NEGATIVE mg/dL
Leukocytes,Ua: NEGATIVE
Nitrite: NEGATIVE
Protein, ur: NEGATIVE mg/dL
Specific Gravity, Urine: 1.018 (ref 1.005–1.030)
pH: 6 (ref 5.0–8.0)

## 2019-10-31 LAB — LIPASE, BLOOD: Lipase: 23 U/L (ref 11–51)

## 2019-10-31 MED ORDER — SODIUM CHLORIDE 0.9% FLUSH: 3.0000 mL | Freq: Once | INTRAVENOUS | Status: DC

## 2019-10-31 NOTE — ED Triage Notes (Signed)
Pt reports feeling sick and weak at home, nauseated, pt feeling like her BP was high but was not able to check it at home. Pt feels like this may be kidney related d/t just being placed on antibiotics last week

## 2019-10-31 NOTE — ED Notes (Signed)
Pt advised against leaving. Pt left waiting room 

## 2019-11-01 LAB — CBC
HCT: 34.8 % — ABNORMAL LOW (ref 36.0–46.0)
Hemoglobin: 11.2 g/dL — ABNORMAL LOW (ref 12.0–15.0)
MCH: 28.1 pg (ref 26.0–34.0)
MCHC: 32.2 g/dL (ref 30.0–36.0)
MCV: 87.2 fL (ref 80.0–100.0)
Platelets: 511 10*3/uL — ABNORMAL HIGH (ref 150–400)
RBC: 3.99 MIL/uL (ref 3.87–5.11)
RDW: 13.4 % (ref 11.5–15.5)
WBC: 14.4 10*3/uL — ABNORMAL HIGH (ref 4.0–10.5)
nRBC: 0 % (ref 0.0–0.2)

## 2019-11-02 LAB — I-STAT BETA HCG BLOOD, ED (MC, WL, AP ONLY): I-stat hCG, quantitative: <5 m[IU]/mL (ref <5)

## 2019-12-08 ENCOUNTER — Other Ambulatory Visit: Payer: Self-pay | Admitting: Family Medicine

## 2019-12-08 DIAGNOSIS — N12 Tubulo-interstitial nephritis, not specified as acute or chronic: Secondary | ICD-10-CM

## 2019-12-08 DIAGNOSIS — R310 Gross hematuria: Secondary | ICD-10-CM

## 2019-12-08 DIAGNOSIS — N151 Renal and perinephric abscess: Secondary | ICD-10-CM

## 2019-12-09 ENCOUNTER — Ambulatory Visit
Admission: RE | Admit: 2019-12-09 | Discharge: 2019-12-09 | Disposition: A | Discharge status: Home / Self Care | Payer: 59 | Source: Ambulatory Visit | Attending: Family Medicine | Admitting: Family Medicine

## 2019-12-09 DIAGNOSIS — N12 Tubulo-interstitial nephritis, not specified as acute or chronic: Secondary | ICD-10-CM

## 2019-12-09 DIAGNOSIS — N151 Renal and perinephric abscess: Secondary | ICD-10-CM

## 2019-12-09 DIAGNOSIS — R310 Gross hematuria: Secondary | ICD-10-CM

## 2019-12-09 MED ORDER — IOPAMIDOL (ISOVUE-300) INJECTION 61%
100.0000 mL | Freq: Once | INTRAVENOUS | Status: AC | PRN
  Administered 2019-12-09: 100 mL via INTRAVENOUS

## 2019-12-15 ENCOUNTER — Other Ambulatory Visit (HOSPITAL_COMMUNITY): Payer: Self-pay | Admitting: Urology

## 2019-12-15 ENCOUNTER — Other Ambulatory Visit: Payer: Self-pay | Admitting: Student

## 2019-12-15 ENCOUNTER — Encounter (HOSPITAL_COMMUNITY): Payer: Self-pay

## 2019-12-15 DIAGNOSIS — N151 Renal and perinephric abscess: Secondary | ICD-10-CM

## 2019-12-15 NOTE — Progress Notes (Signed)
Katrina Lewis Female, 44 y.o., 10/15/1975 MRN:  [id number] Phone:  [phone] (M) ... PCP:  Lujean Amel, MD Coverage:  [place] [facility] Other Next Appt With Radiology ([facility]-CT 1) 12/19/2019 at 9:00 AM  RE: ??? Received: Yesterday Message Contents  Arne Cleveland, MD  Lenore Cordia      Ok   CT R renal abscess drain catheter   DD[facility]   Previous Messages  ----- Message -----  From: Lenore Cordia  Sent: 12/14/2019  2:39 PM EDT  To: Ir Procedure Requests  Subject: ???                        Procedure Requested: CT guided aspiration of right renal abscess vs drain placement vs Biopsy     Reason for Procedure: renal and perinephric abscess    Provider Requesting: Link Snuffer  Provider Telephone: [phone]    Other Info:

## 2019-12-16 ENCOUNTER — Other Ambulatory Visit: Payer: Self-pay | Admitting: Radiology

## 2019-12-18 NOTE — Consult Note (Signed)
Chief Complaint: Right renal abscess  Referring Physician(s): Bell,Eugene D III  Supervising Physician: Jacqulynn Cadet  Patient Status: [facility] - Out-pt  History of Present Illness: Katrina Lewis is a 44 y.o. female History of autoimmune  liver disease and pyelonephritis. Found to have an enlarging an enlarging cystic lesion of the posterior right kidney. [facility] is requesting Right renal abscess drain placement.   Past Medical History:  Diagnosis Date  . 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 Bacterial vaginosis   . CIN I (cervical intraepithelial neoplasia I)     Past Surgical History:  Procedure Laterality Date  . BIOPSY  08/22/2019   Procedure: BIOPSY;  Surgeon: Ronald Lobo, MD;  Location: [place];  Service: Endoscopy;;  . ESOPHAGOGASTRODUODENOSCOPY (EGD) WITH PROPOFOL N/A 08/22/2019   Procedure: ESOPHAGOGASTRODUODENOSCOPY (EGD) WITH PROPOFOL;  Surgeon: Ronald Lobo, MD;  Location: [place];  Service: Endoscopy;  Laterality: N/A;  . LABIOPLASTY Left 03/02/2013   Procedure: LABIAPLASTY;  Surgeon: Delice Lesch, MD;  Location: [facility];  Service: Gynecology;  Laterality: Left;  Wide Local Excision of Left Labia  . LEEP  04/16/2010  . LIVER BIOPSY  2007  . WISDOM TOOTH EXTRACTION      Allergies: Patient has no known allergies.  Medications: Prior to Admission medications   Medication Sig Start Date End Date Taking? Authorizing Provider  amphetamine-dextroamphetamine (ADDERALL) 20 MG tablet Take 20 mg by mouth 2 (two) times daily.    [provider]  pantoprazole (PROTONIX) 40 MG tablet Take 1 tablet (40 mg total) by mouth daily. 08/22/19 11/20/19  Mercy Riding, MD  WELLBUTRIN XL 150 MG 24 hr tablet Take 150 mg by mouth daily. 08/01/19   [provider]     Family History  Problem Relation Age of Onset  . Diabetes Father   . Hypertension Father   . Hypertension Mother     Social History   Socioeconomic  History  . Marital status: Single    Spouse name: Not on file  . Number of children: Not on file  . Years of education: Not on file  . Highest education level: Not on file  Occupational History  . Not on file  Tobacco Use  . Smoking status: Current Every Day Smoker    Packs/day: 1.00    Years: 20.00    Pack years: 20.00    Types: Cigarettes  . Smokeless tobacco: Never Used  Substance and Sexual Activity  . Alcohol use: Yes    Comment: socially  . Drug use: No  . Sexual activity: Yes    Birth control/protection: None  Other Topics Concern  . Not on file  Social History Narrative  . Not on file   Social Determinants of Health   Financial Resource Strain:   . Difficulty of Paying Living Expenses:   Food Insecurity:   . Worried About Charity fundraiser in the Last Year:   . Arboriculturist in the Last Year:   Transportation Needs:   . Film/video editor (Medical):   Marland Kitchen Lack of Transportation (Non-Medical):   Physical Activity:   . Days of Exercise per Week:   . Minutes of Exercise per Session:   Stress:   . Feeling of Stress :   Social Connections:   . Frequency of Communication with Friends and Family:   . Frequency of Social Gatherings with Friends and Family:   . Attends Religious Services:   . Active Member of  Clubs or Organizations:   . Attends Archivist Meetings:   Marland Kitchen Marital Status:      Review of Systems: A 12 point ROS discussed and pertinent positives are indicated in the HPI above.  All other systems are negative.  Review of Systems  Constitutional: Negative for fatigue and fever.  HENT: Negative for congestion.   Respiratory: Negative for cough and shortness of breath.   Gastrointestinal: Positive for abdominal pain (RLQ). Negative for diarrhea, nausea and vomiting.  Genitourinary: Positive for flank pain (Right flank).  Psychiatric/Behavioral: Negative for behavioral problems and confusion.    Vital Signs: There were no vitals  taken for this visit.  Physical Exam Vitals and nursing note reviewed.  Constitutional:      Appearance: She is well-developed.  HENT:     Head: Normocephalic and atraumatic.  Eyes:     Conjunctiva/sclera: Conjunctivae normal.  Cardiovascular:     Rate and Rhythm: Normal rate and regular rhythm.     Heart sounds: Normal heart sounds.  Pulmonary:     Effort: Pulmonary effort is normal.     Breath sounds: Normal breath sounds.  Musculoskeletal:        General: Normal range of motion.     Cervical back: Normal range of motion.  Skin:    General: Skin is warm.  Neurological:     Mental Status: She is alert and oriented to person, place, and time.     Imaging: CT ABDOMEN PELVIS W CONTRAST  Addendum Date: 12/09/2019   ADDENDUM REPORT: 12/09/2019 10:12 ADDENDUM: Findings were discussed by telephone with Dr. Dorthy Cooler at 10:11 a.m., 12/09/2019 Electronically Signed   By: Eddie Candle M.D.   On: 12/09/2019 10:12   Result Date: 12/09/2019 CLINICAL DATA:  Possible kidney mass, possible history of pyelonephritis, history of autoimmune liver disease EXAM: CT ABDOMEN AND PELVIS WITH CONTRAST TECHNIQUE: Multidetector CT imaging of the abdomen and pelvis was performed using the standard protocol following bolus administration of intravenous contrast. CONTRAST:  149mL ISOVUE-300 IOPAMIDOL (ISOVUE-300) INJECTION 61%, additional oral enteric contrast COMPARISON:  CT abdomen pelvis, 10/21/2019, CT abdomen pelvis, MR abdomen, 08/19/2019 FINDINGS: Lower chest: No acute abnormality. Partially imaged nodule of the left lung base measuring approximately 7 mm, which appears to have been present on prior examination dated 10/21/2019 and may have enlarged (series 2, image 1). Hepatobiliary: No solid liver abnormality is seen. Mild intrahepatic biliary ductal dilatation, unchanged compared to prior examination. No gallstones, gallbladder wall thickening, or biliary dilatation. Pancreas: Unremarkable. No pancreatic  ductal dilatation or surrounding inflammatory changes. Spleen: Normal in size without significant abnormality. Adrenals/Urinary Tract: Adrenal glands are unremarkable. Slight interval enlargement of a hypoenhancing, mixed solid cystic lesion of the posterior right kidney measuring approximately 5.5 x 5.5 x 5.3 cm when compared to prior noncontrast examination dated 10/21/2019, and very significantly increased when compared to more remote prior contrast enhanced exam dated 08/19/2027 (series 2, image 35). There appear to be obstructed superior and inferior pole calices. Bladder is unremarkable. Stomach/Bowel: Stomach is within normal limits. Appendix appears normal. No evidence of bowel wall thickening, distention, or inflammatory changes. Vascular/Lymphatic: Enlarged left ovarian vein with left-sided ovarian and uterine varices, the left ovarian vein measuring up to 1.3 cm in caliber near the confluence with the left renal vein. There are enlarged, hypodense right retroperitoneal and aortocaval lymph nodes, measuring up to 1.7 x 1.7 cm, enlarged compared to prior noncontrast examination, previously 1.4 x 1.1 cm (series 2, image 31). Reproductive: No mass or other  significant abnormality. Fluid attenuation cysts and follicles of the bilateral ovaries. Other: No abdominal wall hernia or abnormality. Trace fluid in the low pelvis. Musculoskeletal: No acute or significant osseous findings. IMPRESSION: 1. Slight interval enlargement of a hypoenhancing, mixed solid cystic lesion of the posterior right kidney measuring approximately 5.5 x 5.5 x 5.3 cm when compared to prior noncontrast examination dated 10/21/2019, and very significantly enlarged when compared to more remote prior contrast enhanced exam dated 08/19/2019. Continued enlargement is very worrisome for renal cell carcinoma, although given reported history, pyelonephritis and chronic abscess could have this appearance. 2. There are enlarged, hypodense right  retroperitoneal and aortocaval lymph nodes, measuring up to 1.7 x 1.7 cm, enlarged in the interval and very worrisome for nodal metastatic disease, although reactive lymph nodes could have this appearance. 3. Partially imaged nodule of the left lung base measuring approximately 7 mm, which appears to have been present on prior examination dated 10/21/2019 and may have enlarged, nonspecific. 4. Unchanged, mild intrahepatic biliary ductal dilatation, possibly related to reported history of autoimmune liver disease. 5. Enlarged left ovarian vein with left-sided ovarian and uterine varices, the left ovarian vein measuring up to 1.3 cm in caliber. These findings can be seen in pelvic congestion syndrome clinically referable symptoms are present. 6. Trace fluid in the low pelvis, nonspecific, possibly reactive or functional in the reproductive age setting. Electronically Signed: By: Eddie Candle M.D. On: 12/09/2019 10:04    Labs:  CBC: Recent Labs    08/20/19 0751 08/21/19 0245 08/22/19 0417 10/31/19 2151  WBC 9.6 9.1 8.9 14.4*  HGB 11.5* 11.0* 11.7* 11.2*  HCT 34.1* 32.7* 34.6* 34.8*  PLT 325 281 320 511*    COAGS: Recent Labs    08/19/19 1411  INR 1.0    BMP: Recent Labs    08/20/19 0751 08/21/19 0245 08/22/19 0417 10/31/19 2151  NA 137 137 141 137  K 3.3* 4.2 3.7 3.4*  CL 109 110 110 101  CO2 24 20* 22 25  GLUCOSE 102* 84 90 98  BUN 9 <5* 5* 14  CALCIUM 8.5* 8.5* 8.7* 8.8*  CREATININE 0.77 0.63 0.67 1.11*  GFRNONAA >60 >60 >60 >60  GFRAA >60 >60 >60 >60    LIVER FUNCTION TESTS: Recent Labs    08/20/19 0751 08/21/19 0245 08/22/19 0417 10/31/19 2151  BILITOT 1.1 0.7 0.7 0.2*  AST 114* 56* 43* 25  ALT 330* 245* 200* 39  ALKPHOS 341* 300* 322* 148*  PROT 5.6* 5.3* 5.5* 6.7  ALBUMIN 3.0* 2.9* 2.8*  2.9* 3.3*    Assessment and Plan:  44 y.o, female outpatient. History of autoimmune  liver disease and pyelonephritis. Found to have an enlarging an enlarging cystic  lesion of the posterior right kidney. [facility] is requesting Right renal abscess drain placement.  Pertinent Imaging 5.28.21 - CT abd pelvis reads Slight interval enlargement of a hypoenhancing mixed solid cystic lesion of the posterior right kidney measuring approximately 5.5 X 5.5 X 5.3 cm when compared to non-contrast examination dated 10/21/19  Pertinent IR History 7.12.11 - Liver biopsy  Pertinent Allergies NKDA  All labs and medications are within acceptable parameters.  Patient is afebrile.  Risks and benefits discussed with the patient including bleeding, infection, damage to adjacent structures, bowel perforation/fistula connection, and sepsis.  All of the patient's questions were answered, patient is agreeable to proceed. Consent signed and in chart.     Thank you for this interesting consult.  I greatly enjoyed meeting Katrina Lewis and  look forward to participating in their care.  A copy of this report was sent to the requesting provider on this date.  Electronically Signed: Avel Peace, NP 12/18/2019, 11:12 PM   I spent a total of  30 Minutes   in face to face in clinical consultation, greater than 50% of which was counseling/coordinating care for right renal  abscess

## 2019-12-19 ENCOUNTER — Other Ambulatory Visit: Payer: Self-pay

## 2019-12-19 ENCOUNTER — Observation Stay (HOSPITAL_COMMUNITY)
Admission: RE | Admit: 2019-12-19 | Discharge: 2019-12-19 | Disposition: A | Discharge status: Home / Self Care | Payer: 59 | Source: Ambulatory Visit | Attending: Interventional Radiology | Admitting: Interventional Radiology

## 2019-12-19 ENCOUNTER — Other Ambulatory Visit (HOSPITAL_COMMUNITY): Payer: Self-pay | Admitting: Urology

## 2019-12-19 ENCOUNTER — Ambulatory Visit (HOSPITAL_COMMUNITY)
Admission: RE | Admit: 2019-12-19 | Discharge: 2019-12-19 | Disposition: A | Discharge status: Home / Self Care | Payer: 59 | Source: Ambulatory Visit | Attending: Urology | Admitting: Urology

## 2019-12-19 ENCOUNTER — Encounter (HOSPITAL_COMMUNITY): Payer: Self-pay

## 2019-12-19 DIAGNOSIS — N159 Renal tubulo-interstitial disease, unspecified: Secondary | ICD-10-CM | POA: Diagnosis not present

## 2019-12-19 DIAGNOSIS — N151 Renal and perinephric abscess: Secondary | ICD-10-CM | POA: Diagnosis present

## 2019-12-19 DIAGNOSIS — N280 Ischemia and infarction of kidney: Secondary | ICD-10-CM | POA: Insufficient documentation

## 2019-12-19 LAB — CBC WITH DIFFERENTIAL/PLATELET
Abs Immature Granulocytes: 0.06 10*3/uL (ref 0.00–0.07)
Basophils Absolute: 0.1 10*3/uL (ref 0.0–0.1)
Basophils Relative: 0 %
Eosinophils Absolute: 0.3 10*3/uL (ref 0.0–0.5)
Eosinophils Relative: 2 %
HCT: 33.6 % — ABNORMAL LOW (ref 36.0–46.0)
Hemoglobin: 10.8 g/dL — ABNORMAL LOW (ref 12.0–15.0)
Immature Granulocytes: 0 %
Lymphocytes Relative: 16 %
Lymphs Abs: 2.1 10*3/uL (ref 0.7–4.0)
MCH: 27.9 pg (ref 26.0–34.0)
MCHC: 32.1 g/dL (ref 30.0–36.0)
MCV: 86.8 fL (ref 80.0–100.0)
Monocytes Absolute: 0.8 10*3/uL (ref 0.1–1.0)
Monocytes Relative: 6 %
Neutro Abs: 10.2 10*3/uL — ABNORMAL HIGH (ref 1.7–7.7)
Neutrophils Relative %: 76 %
Platelets: 540 10*3/uL — ABNORMAL HIGH (ref 150–400)
RBC: 3.87 MIL/uL (ref 3.87–5.11)
RDW: 15.2 % (ref 11.5–15.5)
WBC: 13.6 10*3/uL — ABNORMAL HIGH (ref 4.0–10.5)
nRBC: 0 % (ref 0.0–0.2)

## 2019-12-19 LAB — BASIC METABOLIC PANEL
Anion gap: 11 (ref 5–15)
BUN: 16 mg/dL (ref 6–20)
CO2: 26 mmol/L (ref 22–32)
Calcium: 9.3 mg/dL (ref 8.9–10.3)
Chloride: 102 mmol/L (ref 98–111)
Creatinine, Ser: 0.86 mg/dL (ref 0.44–1.00)
GFR calc Af Amer: >60 mL/min (ref >60)
GFR calc non Af Amer: >60 mL/min (ref >60)
Glucose, Bld: 91 mg/dL (ref 70–99)
Potassium: 3.5 mmol/L (ref 3.5–5.1)
Sodium: 139 mmol/L (ref 135–145)

## 2019-12-19 LAB — PROTIME-INR
INR: 0.9 (ref 0.8–1.2)
Prothrombin Time: 12 seconds (ref 11.4–15.2)

## 2019-12-19 MED ORDER — CEFAZOLIN SODIUM-DEXTROSE 2-4 GM/100ML-% IV SOLN: 2.0000 g | Freq: Once | INTRAVENOUS | Status: AC

## 2019-12-19 MED ORDER — FENTANYL CITRATE (PF) 100 MCG/2ML IJ SOLN
INTRAMUSCULAR | Status: AC | PRN
  Administered 2019-12-19 (×2): 50 ug via INTRAVENOUS

## 2019-12-19 MED ORDER — MIDAZOLAM HCL 2 MG/2ML IJ SOLN
INTRAMUSCULAR | Status: AC
  Filled 2019-12-19: qty 4

## 2019-12-19 MED ORDER — SODIUM CHLORIDE 0.9 % IV SOLN: INTRAVENOUS | Status: DC

## 2019-12-19 MED ORDER — FENTANYL CITRATE (PF) 100 MCG/2ML IJ SOLN
INTRAMUSCULAR | Status: AC
  Filled 2019-12-19: qty 4

## 2019-12-19 MED ORDER — HYDROCODONE-ACETAMINOPHEN 5-325 MG PO TABS: 1.0000 | ORAL_TABLET | ORAL | Status: DC | PRN

## 2019-12-19 MED ORDER — CEFAZOLIN SODIUM-DEXTROSE 2-4 GM/100ML-% IV SOLN
INTRAVENOUS | Status: AC
  Administered 2019-12-19: 2 g via INTRAVENOUS
  Filled 2019-12-19: qty 100

## 2019-12-19 MED ORDER — MIDAZOLAM HCL 2 MG/2ML IJ SOLN
INTRAMUSCULAR | Status: AC | PRN
  Administered 2019-12-19 (×4): 1 mg via INTRAVENOUS

## 2019-12-19 MED ORDER — LIDOCAINE HCL (PF) 1 % IJ SOLN
INTRAMUSCULAR | Status: AC | PRN
  Administered 2019-12-19: 10 mL

## 2019-12-19 NOTE — Procedures (Signed)
Interventional Radiology Procedure Note  Procedure: Attempted CT drain placement converted to a CT guided biopsy.   Complications: Unable to place drain, region in question is solid.    Estimated Blood Loss: None  Recommendations: - Path and pan-cultures sent - Bedrest x 2 hrs   Signed,  Criselda Peaches, MD

## 2019-12-19 NOTE — Discharge Instructions (Signed)
Bladder Biopsy, Care After This sheet gives you information about how to care for yourself after your procedure. Your health care provider may also give you more specific instructions. If you have problems or questions, contact your health care provider. What can I expect after the procedure? After the procedure, it is common to have:  Mild pain in your bladder or kidney area during urination.  Rether Rison burning during urination.  Small amounts of blood in your urine.  A sudden urge to urinate.  A need to urinate more often than usual. Follow these instructions at home: Medicines  Take over-the-counter and prescription medicines only as told by your health care provider.  If you were prescribed an antibiotic medicine, take it as told by your health care provider. Do not stop taking the antibiotic even if you start to feel better. Activity  Rest if told by your health care provider.  Do not drive for 24 hours if you received a medicine to help you relax (sedative) during your procedure. Ask your health care provider when it is safe for you to drive.  Return to your normal activities as told by your health care provider. Ask your health care provider what activities are safe for you. General instructions   Take a warm bath to relieve any burning sensations around your urethra.  Hold a warm, damp washcloth over the urethral area to ease pain.  It is up to you to get the results of your procedure. Ask your health care provider, or the department that is doing the procedure, when your results will be ready.  Keep all follow-up visits as told by your health care provider. This is important. Contact a health care provider if:  You have a fever.  Your symptoms do not improve within 24 hours, and you continue to have: ? Burning during urination. ? Increasing amounts of blood in your urine. ? Pain during urination. ? An urgent need to urinate. ? A need to urinate more often than  usual. Get help right away if:  You have a lot of bleeding or more bleeding.  You have severe pain.  You are unable to urinate.  You have bright red blood in your urine.  You are passing blood clots in your urine.  You have a fever. Summary  After the procedure, it is common to have mild pain, burning with urination, and some blood.  Take medicines as told. If you were given antibiotics, finish all of it even if you start to feel better.  Rest after the procedure. Follow your health care provider's instructions for self care at home.  Contact a health care provider if your symptoms do not improve within 24 hours, or if you have more pain or more blood in your urine.  Get help right away if you have a lot of bleeding, severe pain, fever, or bright red blood or blood clots in the urine. This information is not intended to replace advice given to you by your health care provider. Make sure you discuss any questions you have with your health care provider. Document Revised: 01/05/2019 Document Reviewed: 01/05/2019 Elsevier Patient Education  [place]. Moderate Conscious Sedation, Adult, Care After These instructions provide you with information about caring for yourself after your procedure. Your health care provider may also give you more specific instructions. Your treatment has been planned according to current medical practices, but problems sometimes occur. Call your health care provider if you have any problems or questions after your  procedure. What can I expect after the procedure? After your procedure, it is common:  To feel sleepy for several hours.  To feel clumsy and have poor balance for several hours.  To have poor judgment for several hours.  To vomit if you eat too soon. Follow these instructions at home: For at least 24 hours after the procedure:   Do not: ? Participate in activities where you could fall or become injured. ? Drive. ? Use heavy  machinery. ? Drink alcohol. ? Take sleeping pills or medicines that cause drowsiness. ? Make important decisions or sign legal documents. ? Take care of children on your own.  Rest. Eating and drinking  Follow the diet recommended by your health care provider.  If you vomit: ? Drink water, juice, or soup when you can drink without vomiting. ? Make sure you have little or no nausea before eating solid foods. General instructions  Have a responsible adult stay with you until you are awake and alert.  Take over-the-counter and prescription medicines only as told by your health care provider.  If you smoke, do not smoke without supervision.  Keep all follow-up visits as told by your health care provider. This is important. Contact a health care provider if:  You keep feeling nauseous or you keep vomiting.  You feel light-headed.  You develop a rash.  You have a fever. Get help right away if:  You have trouble breathing. This information is not intended to replace advice given to you by your health care provider. Make sure you discuss any questions you have with your health care provider. Document Revised: 06/12/2017 Document Reviewed: 10/20/2015 Elsevier Patient Education  2020 [employer].

## 2019-12-20 LAB — ACID FAST SMEAR (AFB, MYCOBACTERIA): Acid Fast Smear: NEGATIVE

## 2019-12-21 LAB — SURGICAL PATHOLOGY

## 2019-12-24 LAB — AEROBIC/ANAEROBIC CULTURE W GRAM STAIN (SURGICAL/DEEP WOUND): Culture: NO GROWTH

## 2019-12-26 ENCOUNTER — Other Ambulatory Visit: Payer: Self-pay | Admitting: Urology

## 2019-12-30 ENCOUNTER — Other Ambulatory Visit: Payer: Self-pay

## 2019-12-30 ENCOUNTER — Emergency Department (HOSPITAL_COMMUNITY)
Admission: EM | Admit: 2019-12-30 | Discharge: 2019-12-31 | Disposition: A | Discharge status: Home / Self Care | Payer: 59 | Attending: Emergency Medicine | Admitting: Emergency Medicine

## 2019-12-30 ENCOUNTER — Encounter (HOSPITAL_COMMUNITY): Payer: Self-pay

## 2019-12-30 DIAGNOSIS — Z5321 Procedure and treatment not carried out due to patient leaving prior to being seen by health care provider: Secondary | ICD-10-CM | POA: Insufficient documentation

## 2019-12-30 DIAGNOSIS — R109 Unspecified abdominal pain: Secondary | ICD-10-CM | POA: Diagnosis not present

## 2019-12-30 LAB — CBC
HCT: 32.5 % — ABNORMAL LOW (ref 36.0–46.0)
Hemoglobin: 10.3 g/dL — ABNORMAL LOW (ref 12.0–15.0)
MCH: 27.3 pg (ref 26.0–34.0)
MCHC: 31.7 g/dL (ref 30.0–36.0)
MCV: 86.2 fL (ref 80.0–100.0)
Platelets: 558 10*3/uL — ABNORMAL HIGH (ref 150–400)
RBC: 3.77 MIL/uL — ABNORMAL LOW (ref 3.87–5.11)
RDW: 15 % (ref 11.5–15.5)
WBC: 14.4 10*3/uL — ABNORMAL HIGH (ref 4.0–10.5)
nRBC: 0 % (ref 0.0–0.2)

## 2019-12-30 LAB — BASIC METABOLIC PANEL
Anion gap: 10 (ref 5–15)
BUN: 12 mg/dL (ref 6–20)
CO2: 27 mmol/L (ref 22–32)
Calcium: 9.2 mg/dL (ref 8.9–10.3)
Chloride: 99 mmol/L (ref 98–111)
Creatinine, Ser: 0.81 mg/dL (ref 0.44–1.00)
GFR calc Af Amer: >60 mL/min (ref >60)
GFR calc non Af Amer: >60 mL/min (ref >60)
Glucose, Bld: 94 mg/dL (ref 70–99)
Potassium: 3.2 mmol/L — ABNORMAL LOW (ref 3.5–5.1)
Sodium: 136 mmol/L (ref 135–145)

## 2019-12-30 LAB — URINALYSIS, ROUTINE W REFLEX MICROSCOPIC
Bilirubin Urine: NEGATIVE
Glucose, UA: NEGATIVE mg/dL
Hgb urine dipstick: NEGATIVE
Ketones, ur: NEGATIVE mg/dL
Leukocytes,Ua: NEGATIVE
Nitrite: NEGATIVE
Protein, ur: NEGATIVE mg/dL
Specific Gravity, Urine: 1.018 (ref 1.005–1.030)
pH: 5 (ref 5.0–8.0)

## 2019-12-30 LAB — I-STAT BETA HCG BLOOD, ED (MC, WL, AP ONLY): I-stat hCG, quantitative: 7.9 m[IU]/mL — ABNORMAL HIGH (ref <5)

## 2019-12-30 NOTE — ED Triage Notes (Signed)
Pt arrives POV for eval of L sided flank pain. Pt denies dysuria, frequency, urgency, fever chills at home. Reports she has an issue w/ R kidney that has not been figured out yet, and has hx of pyelo with that. States she now has persistent L sided flank pain x 3 days

## 2019-12-30 NOTE — ED Notes (Signed)
Pt asked this NT about wait time then walked out the door without saying anything, then saw pt walking across parking lot from window with family.

## 2020-01-02 ENCOUNTER — Encounter (HOSPITAL_COMMUNITY): Admission: RE | Payer: Self-pay | Source: Home / Self Care

## 2020-01-02 ENCOUNTER — Ambulatory Visit (HOSPITAL_COMMUNITY): Admission: RE | Admit: 2020-01-02 | Payer: 59 | Source: Home / Self Care | Admitting: Urology

## 2020-01-02 SURGERY — CYSTOURETEROSCOPY, WITH STENT INSERTION
Anesthesia: General | Laterality: Right

## 2020-01-18 LAB — FUNGUS CULTURE WITH STAIN

## 2020-01-18 LAB — FUNGUS CULTURE RESULT

## 2020-02-02 LAB — ACID FAST CULTURE WITH REFLEXED SENSITIVITIES (MYCOBACTERIA): Acid Fast Culture: NEGATIVE

## 2020-05-05 ENCOUNTER — Other Ambulatory Visit: Payer: Self-pay

## 2020-05-05 ENCOUNTER — Emergency Department (HOSPITAL_COMMUNITY): Payer: 59

## 2020-05-05 ENCOUNTER — Observation Stay (HOSPITAL_COMMUNITY): Payer: 59

## 2020-05-05 ENCOUNTER — Encounter (HOSPITAL_COMMUNITY): Payer: Self-pay | Admitting: Emergency Medicine

## 2020-05-05 ENCOUNTER — Inpatient Hospital Stay (HOSPITAL_COMMUNITY)
Admission: EM | Admit: 2020-05-05 | Discharge: 2020-05-09 | DRG: 809 | Disposition: A | Discharge status: Home / Self Care | Payer: 59 | Attending: Internal Medicine | Admitting: Internal Medicine

## 2020-05-05 DIAGNOSIS — D6959 Other secondary thrombocytopenia: Secondary | ICD-10-CM | POA: Diagnosis present

## 2020-05-05 DIAGNOSIS — D701 Agranulocytosis secondary to cancer chemotherapy: Secondary | ICD-10-CM | POA: Diagnosis not present

## 2020-05-05 DIAGNOSIS — R112 Nausea with vomiting, unspecified: Secondary | ICD-10-CM

## 2020-05-05 DIAGNOSIS — E860 Dehydration: Secondary | ICD-10-CM | POA: Diagnosis present

## 2020-05-05 DIAGNOSIS — D6481 Anemia due to antineoplastic chemotherapy: Secondary | ICD-10-CM | POA: Diagnosis present

## 2020-05-05 DIAGNOSIS — Z20822 Contact with and (suspected) exposure to covid-19: Secondary | ICD-10-CM | POA: Diagnosis present

## 2020-05-05 DIAGNOSIS — D709 Neutropenia, unspecified: Secondary | ICD-10-CM | POA: Diagnosis not present

## 2020-05-05 DIAGNOSIS — R5081 Fever presenting with conditions classified elsewhere: Secondary | ICD-10-CM | POA: Diagnosis present

## 2020-05-05 DIAGNOSIS — R000 Tachycardia, unspecified: Secondary | ICD-10-CM | POA: Diagnosis present

## 2020-05-05 DIAGNOSIS — B370 Candidal stomatitis: Secondary | ICD-10-CM | POA: Diagnosis present

## 2020-05-05 DIAGNOSIS — C779 Secondary and unspecified malignant neoplasm of lymph node, unspecified: Secondary | ICD-10-CM | POA: Diagnosis present

## 2020-05-05 DIAGNOSIS — D61818 Other pancytopenia: Secondary | ICD-10-CM | POA: Diagnosis present

## 2020-05-05 DIAGNOSIS — F17210 Nicotine dependence, cigarettes, uncomplicated: Secondary | ICD-10-CM | POA: Diagnosis present

## 2020-05-05 DIAGNOSIS — Z8249 Family history of ischemic heart disease and other diseases of the circulatory system: Secondary | ICD-10-CM

## 2020-05-05 DIAGNOSIS — Z79899 Other long term (current) drug therapy: Secondary | ICD-10-CM

## 2020-05-05 DIAGNOSIS — Z833 Family history of diabetes mellitus: Secondary | ICD-10-CM

## 2020-05-05 DIAGNOSIS — I823 Embolism and thrombosis of renal vein: Secondary | ICD-10-CM | POA: Diagnosis not present

## 2020-05-05 DIAGNOSIS — K754 Autoimmune hepatitis: Secondary | ICD-10-CM | POA: Diagnosis present

## 2020-05-05 DIAGNOSIS — J029 Acute pharyngitis, unspecified: Secondary | ICD-10-CM

## 2020-05-05 DIAGNOSIS — D61810 Antineoplastic chemotherapy induced pancytopenia: Principal | ICD-10-CM | POA: Diagnosis present

## 2020-05-05 DIAGNOSIS — C669 Malignant neoplasm of unspecified ureter: Secondary | ICD-10-CM | POA: Diagnosis present

## 2020-05-05 DIAGNOSIS — K769 Liver disease, unspecified: Secondary | ICD-10-CM | POA: Diagnosis present

## 2020-05-05 DIAGNOSIS — T451X5A Adverse effect of antineoplastic and immunosuppressive drugs, initial encounter: Secondary | ICD-10-CM | POA: Diagnosis present

## 2020-05-05 HISTORY — DX: Malignant (primary) neoplasm, unspecified: C80.1

## 2020-05-05 LAB — RESPIRATORY PANEL BY RT PCR (FLU A&B, COVID)
Influenza A by PCR: NEGATIVE
Influenza B by PCR: NEGATIVE
SARS Coronavirus 2 by RT PCR: NEGATIVE

## 2020-05-05 LAB — CBC WITH DIFFERENTIAL/PLATELET
Abs Immature Granulocytes: 0.01 10*3/uL (ref 0.00–0.07)
Basophils Absolute: 0 10*3/uL (ref 0.0–0.1)
Basophils Relative: 1 %
Eosinophils Absolute: 0 10*3/uL (ref 0.0–0.5)
Eosinophils Relative: 0 %
HCT: 23.1 % — ABNORMAL LOW (ref 36.0–46.0)
Hemoglobin: 7.5 g/dL — ABNORMAL LOW (ref 12.0–15.0)
Immature Granulocytes: 1 %
Lymphocytes Relative: 78 %
Lymphs Abs: 0.8 10*3/uL (ref 0.7–4.0)
MCH: 28.2 pg (ref 26.0–34.0)
MCHC: 32.5 g/dL (ref 30.0–36.0)
MCV: 86.8 fL (ref 80.0–100.0)
Monocytes Absolute: 0.1 10*3/uL (ref 0.1–1.0)
Monocytes Relative: 12 %
Neutro Abs: 0.1 10*3/uL — ABNORMAL LOW (ref 1.7–7.7)
Neutrophils Relative %: 8 %
Platelets: 43 10*3/uL — ABNORMAL LOW (ref 150–400)
RBC: 2.66 MIL/uL — ABNORMAL LOW (ref 3.87–5.11)
RDW: 22.8 % — ABNORMAL HIGH (ref 11.5–15.5)
WBC: 1 10*3/uL — CL (ref 4.0–10.5)
nRBC: 0 % (ref 0.0–0.2)

## 2020-05-05 LAB — URINALYSIS, ROUTINE W REFLEX MICROSCOPIC
Bacteria, UA: NONE SEEN
Bilirubin Urine: NEGATIVE
Glucose, UA: NEGATIVE mg/dL
Ketones, ur: NEGATIVE mg/dL
Leukocytes,Ua: NEGATIVE
Nitrite: NEGATIVE
Protein, ur: NEGATIVE mg/dL
Specific Gravity, Urine: 1.006 (ref 1.005–1.030)
pH: 6 (ref 5.0–8.0)

## 2020-05-05 LAB — CBC
HCT: 22.7 % — ABNORMAL LOW (ref 36.0–46.0)
Hemoglobin: 7.3 g/dL — ABNORMAL LOW (ref 12.0–15.0)
MCH: 28 pg (ref 26.0–34.0)
MCHC: 32.2 g/dL (ref 30.0–36.0)
MCV: 87 fL (ref 80.0–100.0)
Platelets: 44 10*3/uL — ABNORMAL LOW (ref 150–400)
RBC: 2.61 MIL/uL — ABNORMAL LOW (ref 3.87–5.11)
RDW: 22.9 % — ABNORMAL HIGH (ref 11.5–15.5)
WBC: 1.1 10*3/uL — CL (ref 4.0–10.5)
nRBC: 0 % (ref 0.0–0.2)

## 2020-05-05 LAB — COMPREHENSIVE METABOLIC PANEL
ALT: 25 U/L (ref 0–44)
AST: 12 U/L — ABNORMAL LOW (ref 15–41)
Albumin: 3.5 g/dL (ref 3.5–5.0)
Alkaline Phosphatase: 173 U/L — ABNORMAL HIGH (ref 38–126)
Anion gap: 10 (ref 5–15)
BUN: 13 mg/dL (ref 6–20)
CO2: 26 mmol/L (ref 22–32)
Calcium: 8.9 mg/dL (ref 8.9–10.3)
Chloride: 97 mmol/L — ABNORMAL LOW (ref 98–111)
Creatinine, Ser: 0.79 mg/dL (ref 0.44–1.00)
GFR, Estimated: >60 mL/min (ref >60)
Glucose, Bld: 98 mg/dL (ref 70–99)
Potassium: 3.7 mmol/L (ref 3.5–5.1)
Sodium: 133 mmol/L — ABNORMAL LOW (ref 135–145)
Total Bilirubin: 0.9 mg/dL (ref 0.3–1.2)
Total Protein: 6.5 g/dL (ref 6.5–8.1)

## 2020-05-05 LAB — LIPASE, BLOOD: Lipase: 18 U/L (ref 11–51)

## 2020-05-05 LAB — I-STAT BETA HCG BLOOD, ED (MC, WL, AP ONLY): I-stat hCG, quantitative: <5 m[IU]/mL (ref <5)

## 2020-05-05 LAB — GROUP A STREP BY PCR: Group A Strep by PCR: NOT DETECTED

## 2020-05-05 LAB — LACTIC ACID, PLASMA: Lactic Acid, Venous: 1.2 mmol/L (ref 0.5–1.9)

## 2020-05-05 MED ORDER — BUPROPION HCL ER (XL) 150 MG PO TB24
150.0000 mg | ORAL_TABLET | Freq: Every day | ORAL | Status: DC
  Administered 2020-05-06 – 2020-05-09 (×4): 150 mg via ORAL
  Filled 2020-05-05 (×4): qty 1

## 2020-05-05 MED ORDER — BUTALBITAL-APAP-CAFFEINE 50-325-40 MG PO TABS
1.0000 | ORAL_TABLET | Freq: Four times a day (QID) | ORAL | Status: DC | PRN
  Administered 2020-05-06: 1 via ORAL
  Filled 2020-05-05 (×2): qty 1

## 2020-05-05 MED ORDER — SODIUM CHLORIDE 0.9 % IV SOLN
2.0000 g | Freq: Three times a day (TID) | INTRAVENOUS | Status: DC
  Administered 2020-05-05 – 2020-05-08 (×8): 2 g via INTRAVENOUS
  Filled 2020-05-05 (×8): qty 2

## 2020-05-05 MED ORDER — VANCOMYCIN HCL 750 MG/150ML IV SOLN
750.0000 mg | Freq: Two times a day (BID) | INTRAVENOUS | Status: DC
  Administered 2020-05-06 – 2020-05-07 (×4): 750 mg via INTRAVENOUS
  Filled 2020-05-05 (×5): qty 150

## 2020-05-05 MED ORDER — SODIUM CHLORIDE 0.9 % IV SOLN: INTRAVENOUS | Status: AC

## 2020-05-05 MED ORDER — IOHEXOL 300 MG/ML  SOLN
100.0000 mL | Freq: Once | INTRAMUSCULAR | Status: AC | PRN
  Administered 2020-05-05: 100 mL via INTRAVENOUS

## 2020-05-05 MED ORDER — PROMETHAZINE HCL 25 MG RE SUPP
25.0000 mg | Freq: Four times a day (QID) | RECTAL | 0 refills | Status: DC | PRN
Start: 2020-05-05 — End: 2020-05-09

## 2020-05-05 MED ORDER — SODIUM CHLORIDE 0.9 % IV BOLUS
1000.0000 mL | Freq: Once | INTRAVENOUS | Status: AC
  Administered 2020-05-05: 1000 mL via INTRAVENOUS

## 2020-05-05 MED ORDER — KETOROLAC TROMETHAMINE 15 MG/ML IJ SOLN
15.0000 mg | Freq: Once | INTRAMUSCULAR | Status: AC
  Administered 2020-05-05: 15 mg via INTRAVENOUS
  Filled 2020-05-05: qty 1

## 2020-05-05 MED ORDER — KETOROLAC TROMETHAMINE 30 MG/ML IJ SOLN
30.0000 mg | Freq: Once | INTRAMUSCULAR | Status: AC
  Administered 2020-05-05: 30 mg via INTRAVENOUS
  Filled 2020-05-05: qty 1

## 2020-05-05 MED ORDER — VANCOMYCIN HCL IN DEXTROSE 1-5 GM/200ML-% IV SOLN
1000.0000 mg | Freq: Once | INTRAVENOUS | Status: AC
  Administered 2020-05-05: 1000 mg via INTRAVENOUS
  Filled 2020-05-05: qty 200

## 2020-05-05 MED ORDER — PROMETHAZINE HCL 25 MG/ML IJ SOLN
25.0000 mg | Freq: Once | INTRAMUSCULAR | Status: AC
  Administered 2020-05-05: 25 mg via INTRAVENOUS
  Filled 2020-05-05: qty 1

## 2020-05-05 MED ORDER — ONDANSETRON HCL 4 MG/2ML IJ SOLN
4.0000 mg | Freq: Once | INTRAMUSCULAR | Status: AC
  Administered 2020-05-05: 4 mg via INTRAVENOUS
  Filled 2020-05-05: qty 2

## 2020-05-05 MED ORDER — ITRACONAZOLE 10 MG/ML PO SOLN: 200.0000 mg | Freq: Every day | ORAL | 0 refills | Status: DC

## 2020-05-05 MED ORDER — PANTOPRAZOLE SODIUM 40 MG PO TBEC
40.0000 mg | DELAYED_RELEASE_TABLET | Freq: Every day | ORAL | Status: DC
  Administered 2020-05-05 – 2020-05-09 (×5): 40 mg via ORAL
  Filled 2020-05-05 (×5): qty 1

## 2020-05-05 NOTE — ED Notes (Signed)
Pt resting comfortably in bed, nadn, vss on ccm, pt watching tv. Side rails up, call bell in place.

## 2020-05-05 NOTE — ED Triage Notes (Signed)
C/o vomiting since Wednesday.  States the last 2 days have been worse.  Pt last received chemo on 10/14 at [facility].  States she is unable to take her nausea medication because of thrush.  Reports headache and throat/mouth pain.

## 2020-05-05 NOTE — ED Provider Notes (Addendum)
[facility] EMERGENCY DEPARTMENT Provider Note   CSN: [id number] Arrival date & time: 05/05/20  1308     History Chief Complaint  Patient presents with  . Emesis    Katrina Lewis is a 44 y.o. female presenting for evaluation nausea, vomiting, sore throat.  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  She is on her last chemo regimen.  She has had increased issues with nausea and vomiting on this regimen, and has struggled with thrush.  She is currently on nystatin and fluconazole, but reports worsening thrush symptoms including tongue and throat pain and swelling.  This is making it difficult for her to eat and drink.  As such, she has had issues with nausea and vomiting, states she has not had any p.o. for the past several days.  She denies fevers, chills, chest pain, shortness of breath, cough, abdominal pain, urinary symptoms, abnormal bowel movements.    HPI     Past Medical History:  Diagnosis Date  . 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 Bacterial vaginosis   . Cancer ([place])   . CIN I (cervical intraepithelial neoplasia I)     Patient Active Problem List   Diagnosis Date Noted  . Elevated liver enzymes 08/19/2019  . Hyperbilirubinemia 08/19/2019  . Epigastric pain 08/19/2019  . Autoimmune hepatitis ([place]) 08/19/2019  . Abnormal finding of kidney 08/19/2019  . Tobacco use 08/19/2019  . 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 Bacterial vaginosis   . CIN I (cervical intraepithelial neoplasia I)   . Condyloma 04/20/2012    Past Surgical History:  Procedure Laterality Date  . BIOPSY  08/22/2019   Procedure: BIOPSY;  Surgeon: Ronald Lobo, MD;  Location: [facility];  Service: Endoscopy;;  . ESOPHAGOGASTRODUODENOSCOPY (EGD) WITH PROPOFOL N/A 08/22/2019   Procedure: ESOPHAGOGASTRODUODENOSCOPY  (EGD) WITH PROPOFOL;  Surgeon: Ronald Lobo, MD;  Location: [place] [facility];  Service: Endoscopy;  Laterality: N/A;  . LABIOPLASTY Left 03/02/2013   Procedure: LABIAPLASTY;  Surgeon: Delice Lesch, MD;  Location: [facility];  Service: Gynecology;  Laterality: Left;  Wide Local Excision of Left Labia  . LEEP  04/16/2010  . LIVER BIOPSY  2007  . WISDOM TOOTH EXTRACTION       OB History    Gravida  0   Para      Term      Preterm      AB      Living        SAB      TAB      Ectopic      Multiple      Live Births              Family History  Problem Relation Age of Onset  . Diabetes Father   . Hypertension Father   . Hypertension Mother     Social History   Tobacco Use  . Smoking status: Current Every Day Smoker    Packs/day: 1.00    Years: 20.00    Pack years: 20.00    Types: Cigarettes  . Smokeless tobacco: Never Used  Substance Use Topics  . Alcohol use: Yes    Comment: socially  . Drug use: No    Home Medications Prior to Admission medications   Medication Sig Start Date End Date Taking? Authorizing Provider  amLODipine (NORVASC) 2.5 MG tablet Take 2.5 mg by mouth daily.    [provider]  amphetamine-dextroamphetamine (ADDERALL) 20 MG tablet Take 20 mg by mouth 2 (two) times daily.    [provider]  itraconazole (SPORANOX) 10 MG/ML solution Take 20 mLs (200 mg total) by mouth daily for 28 days. 05/05/20 06/02/20  Santez Woodcox, PA-C  pantoprazole (PROTONIX) 40 MG tablet Take 1 tablet (40 mg total) by mouth daily. 08/22/19 11/20/19  Mercy Riding, MD  promethazine (PHENERGAN) 25 MG suppository Place 1 suppository (25 mg total) rectally every 6 (six) hours as needed for nausea or vomiting. 05/05/20   Naylin Burkle, PA-C  WELLBUTRIN XL 150 MG 24 hr tablet Take 150 mg by mouth daily. 08/01/19   [provider]    Allergies    Patient has no known allergies.  Review of Systems   Review of Systems  HENT: Positive for  sore throat.   Gastrointestinal: Positive for nausea and vomiting.  All other systems reviewed and are negative.   Physical Exam Updated Vital Signs BP (!) 115/95   Pulse (!) 119   Temp 97.6 F (36.4 C)   Resp 18   Ht 5\' 3"  (1.6 m)   Wt 52.2 kg   SpO2 100%   BMI 20.37 kg/m   Physical Exam Vitals and nursing note reviewed.  Constitutional:      General: She is not in acute distress.    Appearance: She is well-developed.  HENT:     Head: Normocephalic and atraumatic.     Comments: OP and uvula beefy red.  No obvious plaques or exudates.  Uvula midline.  No muffled voice.  No difficulty handling secretions.    Mouth/Throat:     Mouth: Mucous membranes are dry.  Eyes:     Conjunctiva/sclera: Conjunctivae normal.     Pupils: Pupils are equal, round, and reactive to light.  Cardiovascular:     Rate and Rhythm: Regular rhythm. Tachycardia present.     Pulses: Normal pulses.     Comments: Tachycardic around 120 Pulmonary:     Effort: Pulmonary effort is normal. No respiratory distress.     Breath sounds: Normal breath sounds. No wheezing.  Abdominal:     General: There is no distension.     Palpations: Abdomen is soft. There is no mass.     Tenderness: There is no abdominal tenderness. There is no guarding or rebound.     Comments: No TTP of the abdomen.  No rigidity, guarding, distention.  Negative rebound.  No peritonitis.  Musculoskeletal:        General: Normal range of motion.     Cervical back: Normal range of motion and neck supple.  Skin:    General: Skin is warm and dry.     Capillary Refill: Capillary refill takes less than 2 seconds.  Neurological:     Mental Status: She is alert and oriented to person, place, and time.     ED Results / Procedures / Treatments   Labs (all labs ordered are listed, but only abnormal results are displayed) Labs Reviewed  COMPREHENSIVE METABOLIC PANEL - Abnormal; Notable for the following components:      Result Value    Sodium 133 (*)    Chloride 97 (*)    AST 12 (*)    Alkaline Phosphatase 173 (*)    All other components within normal limits  CBC - Abnormal; Notable for the following components:   WBC 1.1 (*)  RBC 2.61 (*)    Hemoglobin 7.3 (*)    HCT 22.7 (*)    RDW 22.9 (*)    Platelets 44 (*)    All other components within normal limits  CBC WITH DIFFERENTIAL/PLATELET - Abnormal; Notable for the following components:   WBC 1.0 (*)    RBC 2.66 (*)    Hemoglobin 7.5 (*)    HCT 23.1 (*)    RDW 22.8 (*)    Platelets 43 (*)    Neutro Abs 0.1 (*)    All other components within normal limits  GROUP A STREP BY PCR  LIPASE, BLOOD  URINALYSIS, ROUTINE W REFLEX MICROSCOPIC  PATHOLOGIST SMEAR REVIEW  I-STAT BETA HCG BLOOD, ED ([facility], [facility], [facility] ONLY)    EKG None  Radiology No results found.  Procedures Procedures (including critical care time)  Medications Ordered in ED Medications  ketorolac (TORADOL) 15 MG/ML injection 15 mg (has no administration in time range)  sodium chloride 0.9 % bolus 1,000 mL (1,000 mLs Intravenous New Bag/Given 05/05/20 1438)  ondansetron (ZOFRAN) injection 4 mg (4 mg Intravenous Given 05/05/20 1438)  promethazine (PHENERGAN) injection 25 mg (25 mg Intravenous Given 05/05/20 1618)  sodium chloride 0.9 % bolus 1,000 mL (1,000 mLs Intravenous New Bag/Given 05/05/20 1618)    ED Course  I have reviewed the triage vital signs and the nursing notes.  Pertinent labs & imaging results that were available during my care of the patient were reviewed by me and considered in my medical decision making (see chart for details).    MDM Rules/Calculators/A&P                          Patient presenting for evaluation nausea, vomiting, sore throat, dehydration.  On exam, patient appears dehydrated.  This is likely due to decreased p.o. intake, will treat symptomatically.  Will obtain labs to look for kidney and liver function.  As patient is now having abdominal pain, I do not  believe she needs emergent CT.  Patient's throat is beefy red, this is likely due to fungal illness with her history, however will also send strep.  Labs interpreted by me, shows leukopenia of 1.1.  Platelets are also low, this is all likely secondary to chemo.  She is afebrile and without symptoms of infection.  Will hold off on antibiotics.  We will continue to treat symptomatically.  Patient with continued nausea after Zofran.  Phenergan and a second liter ordered.  Patient signed out to Jamey Ripa, PA-C.  If symptoms are controlled and patient able to tolerate p.o., consider discharge with symptomatic medications.  If patient remains with nausea/vomiting or unable to tolerate p.o., consider need for admission.  Final Clinical Impression(s) / ED Diagnoses Final diagnoses:  Dehydration  Nausea and vomiting, intractability of vomiting not specified, unspecified vomiting type  Sore throat    Rx / DC Orders ED Discharge Orders         Ordered    promethazine (PHENERGAN) 25 MG suppository  Every 6 hours PRN        05/05/20 1624    itraconazole (SPORANOX) 10 MG/ML solution  Daily        05/05/20 1624           Pennington, Jediah Horger, PA-C 05/05/20 1624    Franchot Heidelberg, PA-C 05/05/20 1631    Lucrezia Starch, MD 05/06/20 [phone]

## 2020-05-05 NOTE — ED Notes (Signed)
Date and time results received: 05/05/20 (use smartphrase ".now" to insert current time)   Test: WBC Critical Value: 1.1  Name of Provider Notified: Dykstra Orders Received? Or Actions Taken?:

## 2020-05-05 NOTE — ED Notes (Signed)
Pt resting in bed. HA resolving

## 2020-05-05 NOTE — Progress Notes (Signed)
Pharmacy Antibiotic Note  Katrina Lewis is a 44 y.o. female admitted on 05/05/2020 with Febrile Neutropenia.  Pharmacy has been consulted for Vancomycin dosing.   Height: 5\' 3"  (160 cm) Weight: 52.2 kg (115 lb) IBW/kg (Calculated) : 52.4  Temp (24hrs), Avg:98.9 F (37.2 C), Min:97.6 F (36.4 C), Max:100.3 F (37.9 C)  Recent Labs  Lab 05/05/20 1335 05/05/20 1440 05/05/20 1920  WBC 1.1* 1.0*  --   CREATININE 0.79  --   --   LATICACIDVEN  --   --  1.2    Estimated Creatinine Clearance: 75.5 mL/min (by C-G formula based on SCr of 0.79 mg/dL).    No Known Allergies  Antimicrobials this admission: 10/23 Cefepime >>  10/23 Vancomycin >>   Dose adjustments this admission: N/a  Microbiology results: Pending   Plan:  - Vancomycin 1000mg  IV x 1 dose  - Followed by Vancomycin 750mg  IV q12h - Monitor patients renal function and urine output  - De-escalate ABX when appropriate   Thank you for allowing pharmacy to be a part of this patient's care.  Duanne Limerick PharmD. BCPS 05/05/2020 9:06 PM

## 2020-05-05 NOTE — H&P (Signed)
History and Physical   Katrina Lewis WFU:[id number] DOB: 03-19-1976 DOA: 05/05/2020  PCP: Lujean Amel, MD  Patient coming from: Home  I have personally briefly reviewed patient's old medical records in Katrina Lewis.  Chief Concern: Nausea vomiting  HPI: Katrina Lewis is a 44 y.o. female with medical history significant for autoimmune hepatitis, ADD, seronegative AIH/autoimmune cholangiopathy, former tobacco smoker, hypercholesterolemia, malignant neoplasm of the urinary bladder currently undergoing neoadjuvant chemotherapy (ddMVAC, started 03/01/2020 with one more treatment remaining) - previous chemo treatment on 04/26/20, hypercalcemia of malignancy with osseous metastatic disease status post IVF and sulfonic acid presented to the ED for chief concern of nausea and vomiting since Wednesday, 05/06/2020.  She states that since that time she has vomited at least 12-14 times.  No blood.  She denies fever.  She endorses chills.  Denies cough, vision changes, chest pain, shortness of breath, abdominal pain, diarrhea, loss of taste and smell, insomnia.  She reports that she generally feels little weak and endorses sore throat.  Social history: Katrina Lewis lives at home by herself and able to perform her daily ADLs.  She states her stepmother takes her to chemo treatment.  She formally used tobacco products, approximately half a pack per day since her teenage years and quit in July 2021, infrequent EtOH use, and endorses nightly THC use since chemotherapy treatment and denies IV drug use.  ED Course: Discussed with ED provider.  Initial plan was for her to get volume resuscitated and supportive care and then discharged from the ED however CBC came back and with neutropenia, fever and developing sinus tachycardia provider felt that she should be admitted for further work-up. As Ms. Twining had a CT-guided urine biopsy done June 2021, advise ED provider to obtain CT abdomen and pelvis with  contrast.  Review of Systems: As per HPI otherwise 10 point review of systems negative.   Past Medical History:  Diagnosis Date  . 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 Bacterial vaginosis   . Cancer ([place])   . CIN I (cervical intraepithelial neoplasia I)    Past Surgical History:  Procedure Laterality Date  . BIOPSY  08/22/2019   Procedure: BIOPSY;  Surgeon: Ronald Lobo, MD;  Location: [facility];  Service: Endoscopy;;  . ESOPHAGOGASTRODUODENOSCOPY (EGD) WITH PROPOFOL N/A 08/22/2019   Procedure: ESOPHAGOGASTRODUODENOSCOPY (EGD) WITH PROPOFOL;  Surgeon: Ronald Lobo, MD;  Location: [facility];  Service: Endoscopy;  Laterality: N/A;  . LABIOPLASTY Left 03/02/2013   Procedure: LABIAPLASTY;  Surgeon: Delice Lesch, MD;  Location: [facility];  Service: Gynecology;  Laterality: Left;  Wide Local Excision of Left Labia  . LEEP  04/16/2010  . LIVER BIOPSY  2007  . WISDOM TOOTH EXTRACTION     Social History:  reports that she has been smoking cigarettes. She has a 20.00 pack-year smoking history. She has never used smokeless tobacco. She reports current alcohol use. She reports that she does not use drugs.  No Known Allergies  Family History  Problem Relation Age of Onset  . Diabetes Father   . Hypertension Father   . Hypertension Mother    Family history: Family history reviewed and negative for history of carcinoma   Prior to Admission medications   Medication Sig Start Date End Date Taking? Authorizing Provider  amLODipine (NORVASC) 2.5 MG tablet Take 2.5 mg by mouth daily.    [provider]  amphetamine-dextroamphetamine (ADDERALL) 20 MG tablet Take 20 mg by mouth 2 (two) times  daily.    [provider]  itraconazole (SPORANOX) 10 MG/ML solution Take 20 mLs (200 mg total) by mouth daily for 28 days. 05/05/20 06/02/20  Caccavale, Sophia, PA-C  pantoprazole (PROTONIX) 40 MG tablet Take 1 tablet (40 mg total) by mouth daily. 08/22/19 11/20/19   Mercy Riding, MD  promethazine (PHENERGAN) 25 MG suppository Place 1 suppository (25 mg total) rectally every 6 (six) hours as needed for nausea or vomiting. 05/05/20   Caccavale, Sophia, PA-C  WELLBUTRIN XL 150 MG 24 hr tablet Take 150 mg by mouth daily. 08/01/19   [provider]   Physical Exam: Vitals:   05/05/20 2249 05/05/20 2300 05/06/20 0000 05/06/20 0214  BP: 120/78 118/82 117/85 113/79  Pulse: (!) 117 (!) 105 (!) 103 (!) 105  Resp: 19 14 14 16   Temp:    99 F (37.2 C)  TempSrc:    Oral  SpO2: 99% 99% 99% 100%  Weight:      Height:       Constitutional: NAD, calm, diaphoretic, thin Eyes: PERRL, lids and conjunctivae normal ENMT: Mucous membranes are moist. Posterior pharynx clear of any exudate or lesions.Normal dentition.  Neck: normal, supple, no masses, no thyromegaly Respiratory: clear to auscultation bilaterally, no wheezing, no crackles. Normal respiratory effort. No accessory muscle use.  Cardiovascular: Regular rate and rhythm, no murmurs / rubs / gallops. No extremity edema. 2+ pedal pulses. No carotid bruits.  Abdomen: no tenderness, no masses palpated. No hepatosplenomegaly. Bowel sounds positive.  Musculoskeletal: no clubbing / cyanosis. No joint deformity upper and lower extremities. Good ROM, no contractures. Normal muscle tone.  Skin: no rashes, lesions, ulcers. No induration Neurologic: CN 2-12 grossly intact. Sensation intact. Strength 5/5 in all 4.  Psychiatric: Normal judgment and insight. Alert and oriented x 3. Normal mood.   Labs on Admission: I have personally reviewed following labs and imaging studies  CBC: Recent Labs  Lab 05/05/20 1335 05/05/20 1440  WBC 1.1* 1.0*  NEUTROABS  --  0.1*  HGB 7.3* 7.5*  HCT 22.7* 23.1*  MCV 87.0 86.8  PLT 44* 43*   Basic Metabolic Panel: Recent Labs  Lab 05/05/20 1335  NA 133*  K 3.7  CL 97*  CO2 26  GLUCOSE 98  BUN 13  CREATININE 0.79  CALCIUM 8.9   GFR: Estimated Creatinine  Clearance: 75.5 mL/min (by C-G formula based on SCr of 0.79 mg/dL). Liver Function Tests: Recent Labs  Lab 05/05/20 1335  AST 12*  ALT 25  ALKPHOS 173*  BILITOT 0.9  PROT 6.5  ALBUMIN 3.5   Recent Labs  Lab 05/05/20 1335  LIPASE 18   Urine analysis:    Component Value Date/Time   COLORURINE YELLOW 05/05/2020 1809   APPEARANCEUR CLEAR 05/05/2020 1809   LABSPEC 1.006 05/05/2020 1809   PHURINE 6.0 05/05/2020 1809   GLUCOSEU NEGATIVE 05/05/2020 1809   HGBUR SMALL (A) 05/05/2020 1809   BILIRUBINUR NEGATIVE 05/05/2020 1809   KETONESUR NEGATIVE 05/05/2020 1809   PROTEINUR NEGATIVE 05/05/2020 1809   NITRITE NEGATIVE 05/05/2020 1809   LEUKOCYTESUR NEGATIVE 05/05/2020 1809   Radiological Exams on Admission: Personally reviewed and I agree with radiologist reading as below.  X-ray chest PA and lateral  Result Date: 05/05/2020 CLINICAL DATA:  Neutropenic fever and vomiting EXAM: CHEST - 2 VIEW COMPARISON:  08/19/2019 FINDINGS: Cardiac shadow is within normal limits. Right chest wall port is noted in satisfactory position new from the prior exam. No focal infiltrate or sizable effusion is seen. No bony  abnormality is noted. IMPRESSION: No active cardiopulmonary disease. Electronically Signed   By: Inez Catalina M.D.   On: 05/05/2020 21:06   CT ABDOMEN PELVIS W CONTRAST  Result Date: 05/05/2020 CLINICAL DATA:  Fever. Abdominal surgery. Abdominal abscess or infection is suspected. EXAM: CT ABDOMEN AND PELVIS WITH CONTRAST TECHNIQUE: Multidetector CT imaging of the abdomen and pelvis was performed using the standard protocol following bolus administration of intravenous contrast. CONTRAST:  124mL OMNIPAQUE IOHEXOL 300 MG/ML  SOLN COMPARISON:  12/09/2019 FINDINGS: Lower chest: Focal infiltration in the left posterior lung base. The previously demonstrated ground-glass nodule is not included within the field of view. Hepatobiliary: No focal liver abnormality is seen. No gallstones, gallbladder  wall thickening, or biliary dilatation. Pancreas: Unremarkable. No pancreatic ductal dilatation or surrounding inflammatory changes. Spleen: Normal in size without focal abnormality. Adrenals/Urinary Tract: No adrenal gland nodules. Mass in the upper pole of the right kidney measuring 4.4 x 4.7 x 6.6 Cm. This is likely renal cell carcinoma. Mild infiltration into the surrounding fat. Suggestion of focal renal vein thrombosis, not extending to the IVC. Left kidney appears normal. No hydronephrosis or hydroureter. Bladder is normal. Stomach/Bowel: The stomach, small bowel, and colon are not abnormally distended. Stool diffusely fills the colon. No inflammatory changes or wall thickening. Appendix is normal. Vascular/Lymphatic: There is a 1.3 cm diameter hypoechoic nodule inferior to the right kidney and new since prior study. This probably represents a metastatic lymph node. No other significant lymphadenopathy. Normal caliber abdominal aorta. No aneurysm. Reproductive: Uterus is not enlarged. Cyst in the left ovary measures 4 cm diameter, mildly enlarged since previous study. Small amount of free fluid in the pelvis is probably physiologic. Other: No free air or free fluid in the abdomen. Abdominal wall musculature appears intact. Musculoskeletal: There is a new ovoid expansile destructive rib lesion involving the left posterior tenth rib. Mildly expansile lucent lesion in the left anterior iliac spine. Heterogeneous vague sclerosis demonstrated in the right inferior ischium. A few additional tiny sclerotic foci are demonstrated in the pelvis. These lesions likely represent metastasis. Multifocal osteomyelitis would be a less likely consideration. IMPRESSION: 1. Mass in the upper pole of the right kidney measuring 4.4 x 4.7 x 6.6 cm, likely renal cell carcinoma. Suggestion of focal renal vein thrombosis, not extending to the IVC. 2. New 1.3 cm diameter hypoechoic nodule inferior to the right kidney probably  represents a metastatic lymph node. 3. New expansile destructive rib lesion involving the left posterior tenth rib. Mildly expansile lucent lesion in the left anterior iliac spine. Heterogeneous vague sclerosis in the right inferior ischium. A few additional tiny sclerotic foci in the pelvis. These lesions likely represent metastasis. Multifocal osteomyelitis would be a less likely consideration. 4. Focal infiltration in the left posterior lung base. The previously demonstrated ground-glass nodule is not included within the field of view. 5. 4 cm diameter left ovarian cyst, mildly enlarged since previous study. Small amount of free fluid in the pelvis is probably physiologic. Electronically Signed   By: Lucienne Capers M.D.   On: 05/05/2020 20:32   EKG: Independently reviewed, showing sinus tachycardia, no St-T wave changes  Assessment/Plan  Principal Problem:   Neutropenia with fever (HCC) Active Problems:   Autoimmune hepatitis ([place])   Neutropenic fever (HCC)   Sinus tachycardia   # Neutropenic fever - suspect secondary to chemo therapy causing myelosuppression - She received ketorolac at approximately 1600 and Tmax registered at 100.3 F at approximately 1700 - ANC is 100 - Blood  cultures have been collected x2 - UA was negative for nitrates and leukocytes - Respiratory panel was negative - Chest x-ray showed no acute cardiopulmonary disease - CT abdomen and pelvis with contrast was negative for CT evidence of active infection - CT showed mass in the upper pole of the right kidney measuring 4.4 x 4.7 x 6.6 cm, likely renal cell carcinoma suggestion of focal renal vein thrombosis, not extending to the IVC - Lactic acid was negative, low clinical suspicion for sepsis at this time - Patient received 1 dose of vancomycin, 1 dose cefepime IV at approximately 2100 on 05/05/2020 - If blood cultures remain negative at 24 hours, consideration to discontinue abx  # Malignant neoplasm of right  renal pelvis and ureter, urinary bladder on ddMAVC - Decadron 12 mg p.o., doxorubicin 45.9 mg, fosaprepitant 150 mg, methotrexate 46 milligrams, vinblastine 4.59 g, Cisplatin 10 7.1 mg IV, - Most recent treatment is 04/26/20 - She has one treatment remaining  # CT evidence of renal cell carcinoma - mass on previous CT; patient is following with oncology and urology at [facility]   DVT prophylaxis: Enoxaparin Code Status: Full code Diet: Regular Family Communication: Patient request that her stepmom, Arbie Cookey be called with updates are indicated at [phone] Disposition Plan: Pending clinical course Consults called: None at this time Admission status: Observation with telemetry  Latonda Larrivee N Kewanda Poland D.O. [facility]  If 7AM-7PM, please contact day-coverage provider [email or web address]  05/06/2020, 2:35 AM

## 2020-05-05 NOTE — ED Notes (Signed)
Patient transported to CT 

## 2020-05-05 NOTE — ED Provider Notes (Addendum)
Care received from [facility].  Please see her note for full HPI.  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  She has also been struggling with thrush and is on nystatin and fluconazole.  Denies any fevers, chills, chest pain, shortness of breath, cough, abdominal pain urinary symptoms.  Work-up performed prior to receiving care showed a CBC with a WBC count of 1, hemoglobin of 7.5, platelets of 43 likely consistent with chemotherapy.  CMP with mild hyponatremia 133, alk phos of 173 but no other abnormalities.  Patient was swabbed for strep given sore throat.  Pregnancy negative.  She had received 2 L of fluids, Toradol, Zofran and Phenergan.  Plan was to p.o. challenge the patient and if she could tolerate fluids to discharge her home.  Patient finished fluid regimen and tolerated p.o. fluids well.  However she remained consistently tachycardic with a rate in the 120s, and began to spike a borderline fever of 100.3.  She denied any urinary symptoms, chest pain, cough, shortness of breath or any other infectious symptoms.  Given the setting of neutropenic fever, the hospitalist team was consulted for admission.  Blood cultures, lactic acid were added.  Per discussion for the hospitalist, will add on a CT of the abdomen and pelvis to further evaluate for possible infection.  Her strep test is negative here. COVID test pending. Patient was informed and is agreeable to admission.  Currently is hemodynamically stable at this time.  Case discussed with Dr. Melina Copa who is agreeable to the above plan and disposition   Physical Exam  BP 122/79 (BP Location: Right Arm)   Pulse (!) 115   Temp 99.6 F (37.6 C) (Oral)   Resp 16   Ht 5' 3" (1.6 m)   Wt 52.2 kg   SpO2 100%   BMI 20.37 kg/m   Physical Exam Vitals and nursing note reviewed.  Constitutional:      General: She is not in acute distress.     Appearance: She is well-developed. She is not ill-appearing or diaphoretic.  HENT:     Head: Normocephalic and atraumatic.     Mouth/Throat:     Mouth: Mucous membranes are moist.     Pharynx: Oropharynx is clear. Posterior oropharyngeal erythema present. No oropharyngeal exudate.     Comments: Oropharynx and tonsils appear mildly enlarged and erythematous.  No obvious exudates.  Uvula midline, no unilateral tonsillar swelling, tongue normal size and midline, no sublingual/submandibular swelling, tolerating secretions well    Eyes:     Conjunctiva/sclera: Conjunctivae normal.  Cardiovascular:     Rate and Rhythm: Regular rhythm. Tachycardia present.     Heart sounds: No murmur heard.   Pulmonary:     Effort: Pulmonary effort is normal. No respiratory distress.     Breath sounds: Normal breath sounds.  Abdominal:     General: Abdomen is flat.     Palpations: Abdomen is soft.     Tenderness: There is no abdominal tenderness.  Musculoskeletal:        General: No tenderness or signs of injury.     Cervical back: Neck supple.  Skin:    General: Skin is warm and dry.     Findings: No erythema or rash.  Neurological:     General: No focal deficit present.     Mental Status: She is alert and oriented to person, place, and time.     ED  Course/Procedures    Results for orders placed or performed during the hospital encounter of 05/05/20  Group A Strep by PCR   Specimen: Throat; Sterile Swab  Result Value Ref Range   Group A Strep by PCR NOT DETECTED NOT DETECTED  Lipase, blood  Result Value Ref Range   Lipase 18 11 - 51 U/L  Comprehensive metabolic panel  Result Value Ref Range   Sodium 133 (L) 135 - 145 mmol/L   Potassium 3.7 3.5 - 5.1 mmol/L   Chloride 97 (L) 98 - 111 mmol/L   CO2 26 22 - 32 mmol/L   Glucose, Bld 98 70 - 99 mg/dL   BUN 13 6 - 20 mg/dL   Creatinine, Ser 0.79 0.44 - 1.00 mg/dL   Calcium 8.9 8.9 - 10.3 mg/dL   Total Protein 6.5 6.5 - 8.1 g/dL   Albumin 3.5  3.5 - 5.0 g/dL   AST 12 (L) 15 - 41 U/L   ALT 25 0 - 44 U/L   Alkaline Phosphatase 173 (H) 38 - 126 U/L   Total Bilirubin 0.9 0.3 - 1.2 mg/dL   GFR, Estimated >60 >60 mL/min   Anion gap 10 5 - 15  CBC  Result Value Ref Range   WBC 1.1 (LL) 4.0 - 10.5 K/uL   RBC 2.61 (L) 3.87 - 5.11 MIL/uL   Hemoglobin 7.3 (L) 12.0 - 15.0 g/dL   HCT 22.7 (L) 36 - 46 %   MCV 87.0 80.0 - 100.0 fL   MCH 28.0 26.0 - 34.0 pg   MCHC 32.2 30.0 - 36.0 g/dL   RDW 22.9 (H) 11.5 - 15.5 %   Platelets 44 (L) 150 - 400 K/uL   nRBC 0.0 0.0 - 0.2 %  Urinalysis, Routine w reflex microscopic Urine, Clean Catch  Result Value Ref Range   Color, Urine YELLOW YELLOW   APPearance CLEAR CLEAR   Specific Gravity, Urine 1.006 1.005 - 1.030   pH 6.0 5.0 - 8.0   Glucose, UA NEGATIVE NEGATIVE mg/dL   Hgb urine dipstick SMALL (A) NEGATIVE   Bilirubin Urine NEGATIVE NEGATIVE   Ketones, ur NEGATIVE NEGATIVE mg/dL   Protein, ur NEGATIVE NEGATIVE mg/dL   Nitrite NEGATIVE NEGATIVE   Leukocytes,Ua NEGATIVE NEGATIVE   RBC / HPF 0-5 0 - 5 RBC/hpf   WBC, UA 0-5 0 - 5 WBC/hpf   Bacteria, UA NONE SEEN NONE SEEN   Squamous Epithelial / LPF 0-5 0 - 5  CBC with Differential/Platelet  Result Value Ref Range   WBC 1.0 (LL) 4.0 - 10.5 K/uL   RBC 2.66 (L) 3.87 - 5.11 MIL/uL   Hemoglobin 7.5 (L) 12.0 - 15.0 g/dL   HCT 23.1 (L) 36 - 46 %   MCV 86.8 80.0 - 100.0 fL   MCH 28.2 26.0 - 34.0 pg   MCHC 32.5 30.0 - 36.0 g/dL   RDW 22.8 (H) 11.5 - 15.5 %   Platelets 43 (L) 150 - 400 K/uL   nRBC 0.0 0.0 - 0.2 %   Neutrophils Relative % 8 %   Neutro Abs 0.1 (L) 1.7 - 7.7 K/uL   Lymphocytes Relative 78 %   Lymphs Abs 0.8 0.7 - 4.0 K/uL   Monocytes Relative 12 %   Monocytes Absolute 0.1 0.1 - 1.0 K/uL   Eosinophils Relative 0 %   Eosinophils Absolute 0.0 0.0 - 0.5 K/uL   Basophils Relative 1 %   Basophils Absolute 0.0 0.0 - 0.1 K/uL   Immature Granulocytes 1 %  Abs Immature Granulocytes 0.01 0.00 - 0.07 K/uL   Tear Drop Cells PRESENT    Lactic acid, plasma  Result Value Ref Range   Lactic Acid, Venous 1.2 0.5 - 1.9 mmol/L  I-Stat beta hCG blood, ED  Result Value Ref Range   I-stat hCG, quantitative <5.0 <5 mIU/mL   Comment 3           No results found.  Procedures  MDM           Lyndel Safe 05/05/20 2004    Hayden Rasmussen, MD 05/06/20 1120

## 2020-05-05 NOTE — ED Notes (Signed)
Provided patient with small amount of food for PO trials.

## 2020-05-05 NOTE — ED Notes (Signed)
Blood cultures collected by phlebotomist

## 2020-05-06 DIAGNOSIS — D701 Agranulocytosis secondary to cancer chemotherapy: Secondary | ICD-10-CM | POA: Diagnosis present

## 2020-05-06 DIAGNOSIS — K754 Autoimmune hepatitis: Secondary | ICD-10-CM | POA: Diagnosis not present

## 2020-05-06 DIAGNOSIS — E860 Dehydration: Secondary | ICD-10-CM | POA: Diagnosis present

## 2020-05-06 DIAGNOSIS — F17210 Nicotine dependence, cigarettes, uncomplicated: Secondary | ICD-10-CM | POA: Diagnosis present

## 2020-05-06 DIAGNOSIS — D6959 Other secondary thrombocytopenia: Secondary | ICD-10-CM | POA: Diagnosis present

## 2020-05-06 DIAGNOSIS — Z8249 Family history of ischemic heart disease and other diseases of the circulatory system: Secondary | ICD-10-CM | POA: Diagnosis not present

## 2020-05-06 DIAGNOSIS — Z833 Family history of diabetes mellitus: Secondary | ICD-10-CM | POA: Diagnosis not present

## 2020-05-06 DIAGNOSIS — I823 Embolism and thrombosis of renal vein: Secondary | ICD-10-CM | POA: Diagnosis not present

## 2020-05-06 DIAGNOSIS — K769 Liver disease, unspecified: Secondary | ICD-10-CM | POA: Diagnosis present

## 2020-05-06 DIAGNOSIS — Z20822 Contact with and (suspected) exposure to covid-19: Secondary | ICD-10-CM | POA: Diagnosis present

## 2020-05-06 DIAGNOSIS — R000 Tachycardia, unspecified: Secondary | ICD-10-CM | POA: Diagnosis not present

## 2020-05-06 DIAGNOSIS — R112 Nausea with vomiting, unspecified: Secondary | ICD-10-CM | POA: Diagnosis present

## 2020-05-06 DIAGNOSIS — R5081 Fever presenting with conditions classified elsewhere: Secondary | ICD-10-CM | POA: Diagnosis not present

## 2020-05-06 DIAGNOSIS — C779 Secondary and unspecified malignant neoplasm of lymph node, unspecified: Secondary | ICD-10-CM | POA: Diagnosis present

## 2020-05-06 DIAGNOSIS — D709 Neutropenia, unspecified: Secondary | ICD-10-CM | POA: Diagnosis not present

## 2020-05-06 DIAGNOSIS — B370 Candidal stomatitis: Secondary | ICD-10-CM | POA: Diagnosis present

## 2020-05-06 DIAGNOSIS — Z79899 Other long term (current) drug therapy: Secondary | ICD-10-CM | POA: Diagnosis not present

## 2020-05-06 DIAGNOSIS — C669 Malignant neoplasm of unspecified ureter: Secondary | ICD-10-CM | POA: Diagnosis present

## 2020-05-06 DIAGNOSIS — D6481 Anemia due to antineoplastic chemotherapy: Secondary | ICD-10-CM | POA: Diagnosis present

## 2020-05-06 DIAGNOSIS — T451X5A Adverse effect of antineoplastic and immunosuppressive drugs, initial encounter: Secondary | ICD-10-CM | POA: Diagnosis present

## 2020-05-06 LAB — COMPREHENSIVE METABOLIC PANEL
ALT: 17 U/L (ref 0–44)
AST: 8 U/L — ABNORMAL LOW (ref 15–41)
Albumin: 2.6 g/dL — ABNORMAL LOW (ref 3.5–5.0)
Alkaline Phosphatase: 124 U/L (ref 38–126)
Anion gap: 8 (ref 5–15)
BUN: 9 mg/dL (ref 6–20)
CO2: 24 mmol/L (ref 22–32)
Calcium: 7.6 mg/dL — ABNORMAL LOW (ref 8.9–10.3)
Chloride: 104 mmol/L (ref 98–111)
Creatinine, Ser: 0.8 mg/dL (ref 0.44–1.00)
GFR, Estimated: >60 mL/min (ref >60)
Glucose, Bld: 79 mg/dL (ref 70–99)
Potassium: 3.5 mmol/L (ref 3.5–5.1)
Sodium: 136 mmol/L (ref 135–145)
Total Bilirubin: 0.7 mg/dL (ref 0.3–1.2)
Total Protein: 4.9 g/dL — ABNORMAL LOW (ref 6.5–8.1)

## 2020-05-06 LAB — CBC WITH DIFFERENTIAL/PLATELET
Abs Immature Granulocytes: 0.02 10*3/uL (ref 0.00–0.07)
Basophils Absolute: 0 10*3/uL (ref 0.0–0.1)
Basophils Relative: 0 %
Eosinophils Absolute: 0 10*3/uL (ref 0.0–0.5)
Eosinophils Relative: 0 %
HCT: 17.3 % — ABNORMAL LOW (ref 36.0–46.0)
Hemoglobin: 5.7 g/dL — CL (ref 12.0–15.0)
Immature Granulocytes: 1 %
Lymphocytes Relative: 75 %
Lymphs Abs: 1.1 10*3/uL (ref 0.7–4.0)
MCH: 28.5 pg (ref 26.0–34.0)
MCHC: 32.9 g/dL (ref 30.0–36.0)
MCV: 86.5 fL (ref 80.0–100.0)
Monocytes Absolute: 0.2 10*3/uL (ref 0.1–1.0)
Monocytes Relative: 14 %
Neutro Abs: 0.1 10*3/uL — ABNORMAL LOW (ref 1.7–7.7)
Neutrophils Relative %: 10 %
Platelets: 31 10*3/uL — ABNORMAL LOW (ref 150–400)
RBC: 2 MIL/uL — ABNORMAL LOW (ref 3.87–5.11)
RDW: 23 % — ABNORMAL HIGH (ref 11.5–15.5)
WBC: 1.4 10*3/uL — CL (ref 4.0–10.5)
nRBC: 0 % (ref 0.0–0.2)

## 2020-05-06 LAB — ABO/RH: ABO/RH(D): A POS

## 2020-05-06 LAB — HEMOGLOBIN AND HEMATOCRIT, BLOOD
HCT: 23.4 % — ABNORMAL LOW (ref 36.0–46.0)
Hemoglobin: 7.7 g/dL — ABNORMAL LOW (ref 12.0–15.0)

## 2020-05-06 LAB — LACTIC ACID, PLASMA: Lactic Acid, Venous: 0.8 mmol/L (ref 0.5–1.9)

## 2020-05-06 LAB — PREPARE RBC (CROSSMATCH)

## 2020-05-06 MED ORDER — FLUCONAZOLE 100MG IVPB
100.0000 mg | INTRAVENOUS | Status: DC
  Administered 2020-05-07 – 2020-05-08 (×2): 100 mg via INTRAVENOUS
  Filled 2020-05-06 (×2): qty 50

## 2020-05-06 MED ORDER — SODIUM CHLORIDE 0.9% IV SOLUTION: Freq: Once | INTRAVENOUS | Status: AC

## 2020-05-06 MED ORDER — FLUCONAZOLE IN SODIUM CHLORIDE 200-0.9 MG/100ML-% IV SOLN
200.0000 mg | Freq: Once | INTRAVENOUS | Status: AC
  Administered 2020-05-06: 200 mg via INTRAVENOUS
  Filled 2020-05-06: qty 100

## 2020-05-06 MED ORDER — LIDOCAINE VISCOUS HCL 2 % MT SOLN
15.0000 mL | OROMUCOSAL | Status: DC | PRN
  Administered 2020-05-06 (×2): 15 mL via OROMUCOSAL
  Filled 2020-05-06 (×2): qty 15

## 2020-05-06 NOTE — Progress Notes (Signed)
[place]

## 2020-05-06 NOTE — Progress Notes (Addendum)
   05/06/20 0636  Provider Notification  Provider Name/Title Dr Myna Hidalgo  Date Provider Notified 05/06/20  Time Provider Notified [phone]  Notification Type Page  Notification Reason  (HGB 5.7)  Response Other (Comment) (waiting on orders)  CRITICAL VALUE ALERT  Critical Value: HGB- 5.7  Date & Time Notied:  05/06/2020 0636  Provider Notified: Dr. Myna Hidalgo  Orders Received/Actions taken: waiting on orders  0652: Received call from Dr. Myna Hidalgo-  I unit of PRBC ordered.   Patient denies dizziness at this time.

## 2020-05-06 NOTE — Progress Notes (Addendum)
[facility] PROGRESS NOTE    Progress Note  CANYON WILLOW  JWJ:[id number] DOB: 11-18-1975 DOA: 05/05/2020 PCP: Lujean Amel, MD     Brief Narrative:   Katrina Lewis is an 44 y.o. 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  Her ANC was 100. Blood cultures and urine cultures were collected x2 which are pending at the time of the dictation. Respiratory panel is negative chest x-ray showed no acute cardiopulmonary disease. CT scan of the abdomen pelvis with contrast showed no acute findings, I did showed a mass in the right upper pole of the kidney suggesting renal cell carcinoma with focal renal vein thrombosis involvement but not extending to the IVC.New expansile destructive rib lesion involving the left posterior tenth rib. She was started on IV empiric antibiotics vancomycin and cefepime.  Poorly differentiated metastatic urothelial carcinoma of the renal pelvis: With last chemotherapy on 04/26/2020 there is likely the cause of her neutropenia.  New CT evidence of focal involvement with renal vein thrombosis: Noted, follow-up with oncology at [facility] as an outpatient. He is currently asymptomatic.  Oral thrush: We will start her on IV Diflucan  Autoimmune hepatitis ([place]) Noted.  DVT prophylaxis: lovenxo Family Communication:none Status is: Observation  The patient will require care spanning > 2 midnights and should be moved to  inpatient because: Hemodynamically unstable  Dispo: The patient is from: Home              Anticipated d/c is to: Home              Anticipated d/c date is: > 3 days              Patient currently is not medically stable to d/c.    Code Status:     Code Status Orders  (From admission, onward)         Start     Ordered   05/05/20 1952  Full code  Continuous        05/05/20 2003        Code Status History    Date Active Date Inactive Code Status Order ID Comments User Context   08/19/2019 0828 08/22/2019 2039 Full Code [id number]  Norval Morton, MD ED   Advance Care Planning Activity        IV Access:    Peripheral IV   Procedures and diagnostic studies:   X-ray chest PA and lateral  Result Date: 05/05/2020 CLINICAL DATA:  Neutropenic fever and vomiting EXAM: CHEST - 2 VIEW COMPARISON:  08/19/2019 FINDINGS: Cardiac shadow is within normal limits. Right chest wall port is noted in satisfactory position new from the prior exam. No focal infiltrate or sizable effusion is seen. No bony abnormality is noted. IMPRESSION: No active cardiopulmonary disease. Electronically Signed   By: Inez Catalina M.D.   On: 05/05/2020 21:06   CT ABDOMEN PELVIS W CONTRAST  Result Date: 05/05/2020 CLINICAL DATA:  Fever. Abdominal surgery. Abdominal abscess or infection is suspected. EXAM: CT ABDOMEN AND PELVIS WITH CONTRAST TECHNIQUE: Multidetector CT imaging of the abdomen and pelvis was performed using  the standard protocol following bolus administration of intravenous contrast. CONTRAST:  139mL OMNIPAQUE IOHEXOL 300 MG/ML  SOLN COMPARISON:  12/09/2019 FINDINGS: Lower chest: Focal infiltration in the left posterior lung base. The previously demonstrated ground-glass nodule is not included within the field of view. Hepatobiliary: No focal liver abnormality is seen. No gallstones, gallbladder wall thickening, or biliary dilatation. Pancreas: Unremarkable. No pancreatic ductal dilatation or  surrounding inflammatory changes. Spleen: Normal in size without focal abnormality. Adrenals/Urinary Tract: No adrenal gland nodules. Mass in the upper pole of the right kidney measuring 4.4 x 4.7 x 6.6 Cm. This is likely renal cell carcinoma. Mild infiltration into the surrounding fat. Suggestion of focal renal vein thrombosis, not extending to the IVC. Left kidney appears normal. No hydronephrosis or hydroureter. Bladder is normal. Stomach/Bowel: The stomach, small bowel, and colon are not abnormally distended. Stool diffusely fills the colon. No inflammatory changes or wall thickening. Appendix is normal. Vascular/Lymphatic: There is a 1.3 cm diameter hypoechoic nodule inferior to the right kidney and new since prior study. This probably represents a metastatic lymph node. No other significant lymphadenopathy. Normal caliber abdominal aorta. No aneurysm. Reproductive: Uterus is not enlarged. Cyst in the left ovary measures 4 cm diameter, mildly enlarged since previous study. Small amount of free fluid in the pelvis is probably physiologic. Other: No free air or free fluid in the abdomen. Abdominal wall musculature appears intact. Musculoskeletal: There is a new ovoid expansile destructive rib lesion involving the left posterior tenth rib. Mildly expansile lucent lesion in the left anterior iliac spine. Heterogeneous vague sclerosis demonstrated in the right inferior ischium. A few additional tiny sclerotic foci are demonstrated in the pelvis. These lesions likely represent metastasis. Multifocal osteomyelitis would be a less likely consideration. IMPRESSION: 1. Mass in the upper pole of the right kidney measuring 4.4 x 4.7 x 6.6 cm, likely renal cell carcinoma. Suggestion of focal renal vein thrombosis, not extending to the IVC. 2. New 1.3 cm diameter hypoechoic nodule inferior to the right kidney probably represents a metastatic lymph node. 3. New expansile destructive rib lesion involving the left posterior  tenth rib. Mildly expansile lucent lesion in the left anterior iliac spine. Heterogeneous vague sclerosis in the right inferior ischium. A few additional tiny sclerotic foci in the pelvis. These lesions likely represent metastasis. Multifocal osteomyelitis would be a less likely consideration. 4. Focal infiltration in the left posterior lung base. The previously demonstrated ground-glass nodule is not included within the field of view. 5. 4 cm diameter left ovarian cyst, mildly enlarged since previous study. Small amount of free fluid in the pelvis is probably physiologic. Electronically Signed   By: Lucienne Capers M.D.   On: 05/05/2020 20:32     Medical Consultants:    None.  Anti-Infectives:   vanc cefepime  Subjective:    Kennedy Bucker she relates she has terrible burning in her mouth and throat  Objective:    Vitals:   05/05/20 2300 05/06/20 0000 05/06/20 0214 05/06/20 0621  BP: 118/82 117/85 113/79 117/86  Pulse: (!) 105 (!) 103 (!) 105 (!) 103  Resp: 14 14 16 15   Temp:   99 F (37.2 C) 98.4 F (36.9 C)  TempSrc:   Oral Oral  SpO2: 99% 99% 100% 100%  Weight:   52 kg   Height:   5\' 3"  (1.6 m)    SpO2: 100 %   Intake/Output Summary (Last 24 hours) at 05/06/2020 0655 Last data filed at 05/06/2020 0649 Gross per 24 hour  Intake 525 ml  Output 650 ml  Net -125 ml   Filed Weights   05/05/20 1314 05/06/20 0214  Weight: 52.2 kg 52 kg    Exam: General exam: In no acute distress, oral thrush Respiratory system: Good air movement and clear to auscultation. Cardiovascular system: S1 & S2 heard, RRR. No JVD. Gastrointestinal system: Abdomen is nondistended, soft and nontender.  Extremities: No pedal edema. Skin: No rashes, lesions or ulcers Psychiatry: Judgement and insight appear normal. Mood & affect appropriate.    Data Reviewed:    Labs: Basic Metabolic Panel: Recent Labs  Lab 05/05/20 1335 05/06/20 0318  NA 133* 136  K 3.7 3.5  CL 97* 104    CO2 26 24  GLUCOSE 98 79  BUN 13 9  CREATININE 0.79 0.80  CALCIUM 8.9 7.6*   GFR Estimated Creatinine Clearance: 75.2 mL/min (by C-G formula based on SCr of 0.8 mg/dL). Liver Function Tests: Recent Labs  Lab 05/05/20 1335 05/06/20 0318  AST 12* 8*  ALT 25 17  ALKPHOS 173* 124  BILITOT 0.9 0.7  PROT 6.5 4.9*  ALBUMIN 3.5 2.6*   Recent Labs  Lab 05/05/20 1335  LIPASE 18   No results for input(s): AMMONIA in the last 168 hours. Coagulation profile No results for input(s): INR, PROTIME in the last 168 hours. COVID-19 Labs  No results for input(s): DDIMER, FERRITIN, LDH, CRP in the last 72 hours.  Lab Results  Component Value Date   SARSCOV2NAA NEGATIVE 05/05/2020   [place] NEGATIVE 08/19/2019    CBC: Recent Labs  Lab 05/05/20 1335 05/05/20 1440 05/06/20 0318  WBC 1.1* 1.0* 1.4*  NEUTROABS  --  0.1* 0.1*  HGB 7.3* 7.5* 5.7*  HCT 22.7* 23.1* 17.3*  MCV 87.0 86.8 86.5  PLT 44* 43* 31*   Cardiac Enzymes: No results for input(s): CKTOTAL, CKMB, CKMBINDEX, TROPONINI in the last 168 hours. BNP (last 3 results) No results for input(s): PROBNP in the last 8760 hours. CBG: No results for input(s): GLUCAP in the last 168 hours. D-Dimer: No results for input(s): DDIMER in the last 72 hours. Hgb A1c: No results for input(s): HGBA1C in the last 72 hours. Lipid Profile: No results for input(s): CHOL, HDL, LDLCALC, TRIG, CHOLHDL, LDLDIRECT in the last 72 hours. Thyroid function studies: No results for input(s): TSH, T4TOTAL, T3FREE, THYROIDAB in the last 72 hours.  Invalid input(s): FREET3 Anemia work up: No results for input(s): VITAMINB12, FOLATE, FERRITIN, TIBC, IRON, RETICCTPCT in the last 72 hours. Sepsis Labs: Recent Labs  Lab 05/05/20 1335 05/05/20 1440 05/05/20 1920 05/06/20 0318  WBC 1.1* 1.0*  --  1.4*  LATICACIDVEN  --   --  1.2 0.8   Microbiology Recent Results (from the past 240 hour(s))  Group A Strep by PCR     Status: None    Collection Time: 05/05/20  4:12 PM   Specimen: Throat; Sterile Swab  Result Value Ref Range Status   Group A Strep by PCR NOT DETECTED NOT DETECTED Final    Comment: Performed at [facility], [address] [place], [place] [address]  Respiratory Panel by RT PCR (Flu A&B, Covid) - Nasopharyngeal Swab     Status: None   Collection Time: 05/05/20  7:49 PM   Specimen: Nasopharyngeal Swab  Result Value Ref Range Status   SARS Coronavirus 2 by RT PCR NEGATIVE NEGATIVE Final    Comment: (NOTE) SARS-CoV-2 target nucleic acids are NOT DETECTED.  The SARS-CoV-2 RNA is generally detectable in upper  respiratoy specimens during the acute phase of infection. The lowest concentration of SARS-CoV-2 viral copies this assay can detect is 131 copies/mL. A negative result does not preclude SARS-Cov-2 infection and should not be used as the sole basis for treatment or other patient management decisions. A negative result may occur with  improper specimen collection/handling, submission of specimen other than nasopharyngeal swab, presence of viral mutation(s) within the areas targeted by this assay, and inadequate number of viral copies (<131 copies/mL). A negative result must be combined with clinical observations, patient history, and epidemiological information. The expected result is Negative.  Fact Sheet for Patients:  PinkCheek.be  Fact Sheet for Healthcare Providers:  GravelBags.it  This test is no t yet approved or cleared by the [place] FDA and  has been authorized for detection and/or diagnosis of SARS-CoV-2 by FDA under an Emergency Use Authorization (EUA). This EUA will remain  in effect (meaning this test can be used) for the duration of the COVID-19 declaration under Section 564(b)(1) of the Act, 21 U.S.C. section 360bbb-3(b)(1), unless the authorization is terminated or revoked sooner.     Influenza A by PCR  NEGATIVE NEGATIVE Final   Influenza B by PCR NEGATIVE NEGATIVE Final    Comment: (NOTE) The Xpert Xpress SARS-CoV-2/FLU/RSV assay is intended as an aid in  the diagnosis of influenza from Nasopharyngeal swab specimens and  should not be used as a sole basis for treatment. Nasal washings and  aspirates are unacceptable for Xpert Xpress SARS-CoV-2/FLU/RSV  testing.  Fact Sheet for Patients: PinkCheek.be  Fact Sheet for Healthcare Providers: GravelBags.it  This test is not yet approved or cleared by the [place] FDA and  has been authorized for detection and/or diagnosis of SARS-CoV-2 by  FDA under an Emergency Use Authorization (EUA). This EUA will remain  in effect (meaning this test can be used) for the duration of the  Covid-19 declaration under Section 564(b)(1) of the Act, 21  U.S.C. section 360bbb-3(b)(1), unless the authorization is  terminated or revoked. Performed at [facility], [place] [address], [place], [place] [address]      Medications:   . sodium chloride   Intravenous Once  . buPROPion  150 mg Oral Daily  . pantoprazole  40 mg Oral Daily   Continuous Infusions: . sodium chloride 125 mL/hr at 05/05/20 2120  . ceFEPime (MAXIPIME) IV Stopped (05/05/20 2154)  . vancomycin        LOS: 0 days   Charlynne Cousins  [facility]  05/06/2020, 6:55 AM

## 2020-05-07 DIAGNOSIS — R5081 Fever presenting with conditions classified elsewhere: Secondary | ICD-10-CM | POA: Diagnosis not present

## 2020-05-07 DIAGNOSIS — R112 Nausea with vomiting, unspecified: Secondary | ICD-10-CM | POA: Diagnosis not present

## 2020-05-07 DIAGNOSIS — D709 Neutropenia, unspecified: Secondary | ICD-10-CM | POA: Diagnosis not present

## 2020-05-07 LAB — TYPE AND SCREEN
ABO/RH(D): A POS
Antibody Screen: NEGATIVE
Unit division: 0

## 2020-05-07 LAB — BPAM RBC
Blood Product Expiration Date: 202111052359
ISSUE DATE / TIME: 202110241353
Unit Type and Rh: 6200

## 2020-05-07 MED ORDER — ENSURE ENLIVE PO LIQD
237.0000 mL | Freq: Two times a day (BID) | ORAL | Status: DC
  Administered 2020-05-08 – 2020-05-09 (×3): 237 mL via ORAL

## 2020-05-07 MED ORDER — ADULT MULTIVITAMIN W/MINERALS CH
1.0000 | ORAL_TABLET | Freq: Every day | ORAL | Status: DC
  Administered 2020-05-07 – 2020-05-09 (×3): 1 via ORAL
  Filled 2020-05-07 (×3): qty 1

## 2020-05-07 NOTE — Progress Notes (Signed)
Initial Nutrition Assessment  DOCUMENTATION CODES:   Not applicable  INTERVENTION:   -Ensure Enlive po BID, each supplement provides 350 kcal and 20 grams of protein -MVI with minerals daily  NUTRITION DIAGNOSIS:   Increased nutrient needs related to cancer and cancer related treatments as evidenced by estimated needs.  GOAL:   Patient will meet greater than or equal to 90% of their needs  MONITOR:   PO intake, Supplement acceptance, Labs, Weight trends, Skin, I & O's  REASON FOR ASSESSMENT:   Malnutrition Screening Tool    ASSESSMENT:   Katrina Lewis is a 44 y.o. female with medical history significant for autoimmune hepatitis, ADD, seronegative AIH/autoimmune cholangiopathy, former tobacco smoker, hypercholesterolemia, malignant neoplasm of the urinary bladder currently undergoing neoadjuvant chemotherapy (ddMVAC, started 03/01/2020 with one more treatment remaining) - previous chemo treatment on 04/26/20, hypercalcemia of malignancy with osseous metastatic disease status post IVF and sulfonic acid presented to the ED for chief concern of nausea and vomiting since Wednesday, 05/06/2020  Pt admitted with neutropenic fever.   Reviewed I/O's: +246ml x 24 hours and +98 ml since admission  UOP: 1.5 L x 24 hours  Spoke with pt at bedside, who reports feeling better today. She shares that her appetite has been decreased, but improving. Pt shares that she has had a sore throat and dysphagia, which has impacted her eating (this has improved since admission). She has been eating mostly softer foods, such as soups and mashed potatoes.   Observed lunch meal- pt consumed about 75% of tray- she reports her mom is also bringing her a McFlurry later on today.   Pt endorses weight loss, but reports it is hard to quantify due to cancer diagnosis. She shares that she noticed the weight loss in May, which was intentional, however, continued to lose weight due to cancer treatments and  chemotherapy. Reviewed wt hx; pt has experienced a 11.8% wt loss over the past 6 months, which is significant for time frame.   Discussed importance of good meal and supplement intake to promote healing. Pt amenable to trying Ensure supplements, as she was considering trying them PTA.   Labs reviewed.   NUTRITION - FOCUSED PHYSICAL EXAM:    Most Recent Value  Orbital Region No depletion  Upper Arm Region No depletion  Thoracic and Lumbar Region No depletion  Buccal Region No depletion  Temple Region No depletion  Clavicle Bone Region No depletion  Clavicle and Acromion Bone Region No depletion  Scapular Bone Region No depletion  Dorsal Hand No depletion  Patellar Region Mild depletion  Anterior Thigh Region Mild depletion  Posterior Calf Region Mild depletion  Edema (RD Assessment) None  Hair Reviewed  Eyes Reviewed  Mouth Reviewed  Skin Reviewed  Nails Reviewed       Diet Order:   Diet Order            Diet regular Room service appropriate? Yes; Fluid consistency: Thin  Diet effective now                 EDUCATION NEEDS:   Education needs have been addressed  Skin:  Skin Assessment: Reviewed RN Assessment  Last BM:  05/05/20  Height:   Ht Readings from Last 1 Encounters:  05/06/20 5\' 3"  (1.6 m)    Weight:   Wt Readings from Last 1 Encounters:  05/06/20 52 kg    Ideal Body Weight:  52.3 kg  BMI:  Body mass index is 20.3 kg/m.  Estimated Nutritional  Needs:   Kcal:  1800-2000  Protein:  105-120 grams  Fluid:  > 1.8 L    Loistine Chance, RD, LDN, [place] Registered Dietitian II Certified Diabetes Care and Education Specialist Please refer to [facility] for RD and/or RD on-call/weekend/after hours pager

## 2020-05-07 NOTE — Progress Notes (Signed)
[facility] PROGRESS NOTE    Progress Note  KEMARA QUIGLEY  URK:[id number] DOB: 1976-07-06 DOA: 05/05/2020 PCP: Lujean Amel, MD     Brief Narrative:   KAILAN CARMEN is an 44 y.o. 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  Her ANC was 100. Culture data have remained negative till date, she has remained afebrile tachycardia has resolved. CT scan of the abdomen and pelvis showed no acute findings she will need to follow-up with oncology as an outpatient but it did showed focal renal vein thrombosis not extending into the IVC.  And a new expansive destructive rib lesion in the left posterior 10th rib Continue IV empiric antibiotics, CBC with differential is pending this morning.  Chemotherapy-induced anemia: She is status post 2 units of packed red blood cells recheck hemoglobin was 7.7. CBC with differential is pending this morning.  Chemotherapy-induced thrombocytopenia: Likely due to chemotherapy: Anticoagulation was held her CBC with differential was pending this morning.  Poorly differentiated metastatic urothelial carcinoma of the renal pelvis: With last chemotherapy on 04/26/2020 there is likely the cause of her neutropenia.  New CT evidence of focal involvement with renal vein thrombosis: Noted, follow-up with oncology at [facility] as an outpatient. He is currently asymptomatic.  Oral thrush: We will start her on IV  Diflucan  Autoimmune hepatitis ([place]) Noted.  DVT prophylaxis: lovenxo Family Communication:none Status is: Observation  The patient will require care spanning > 2 midnights and should be moved to inpatient because: Hemodynamically unstable  Dispo: The patient is from: Home              Anticipated d/c is to: Home              Anticipated d/c date is: 1days              Patient currently is not medically stable to d/c.    Code Status:     Code Status Orders  (From admission, onward)         Start     Ordered   05/05/20 1952  Full code  Continuous        05/05/20 2003        Code Status History    Date Active Date Inactive Code Status Order ID Comments User Context   08/19/2019 0828 08/22/2019 2039 Full Code [id number]  Norval Morton, MD ED   Advance Care Planning Activity        IV Access:    Peripheral IV   Procedures and diagnostic studies:   X-ray chest PA and lateral  Result Date: 05/05/2020 CLINICAL DATA:  Neutropenic fever and vomiting EXAM: CHEST - 2 VIEW COMPARISON:  08/19/2019 FINDINGS: Cardiac shadow is within normal limits. Right chest wall port is noted in satisfactory position new from the prior exam. No focal infiltrate or sizable effusion is seen. No bony abnormality is noted. IMPRESSION: No active cardiopulmonary disease. Electronically Signed   By: Inez Catalina M.D.   On: 05/05/2020 21:06   CT ABDOMEN PELVIS W CONTRAST  Result Date: 05/05/2020 CLINICAL DATA:  Fever. Abdominal surgery.  Abdominal abscess or infection is suspected. EXAM: CT ABDOMEN AND PELVIS WITH CONTRAST TECHNIQUE: Multidetector CT imaging of the abdomen and pelvis was performed using the standard protocol following bolus administration of intravenous contrast. CONTRAST:  136mL OMNIPAQUE IOHEXOL 300 MG/ML  SOLN COMPARISON:  12/09/2019 FINDINGS: Lower chest: Focal infiltration in the left posterior lung base. The previously demonstrated ground-glass nodule is not included within  the field of view. Hepatobiliary: No focal liver abnormality is seen. No gallstones, gallbladder wall thickening, or biliary dilatation. Pancreas: Unremarkable. No pancreatic ductal dilatation or surrounding inflammatory changes. Spleen: Normal in size without focal abnormality. Adrenals/Urinary Tract: No adrenal gland nodules. Mass in the upper pole of the right kidney measuring 4.4 x 4.7 x 6.6 Cm. This is likely renal cell carcinoma. Mild infiltration into the surrounding fat. Suggestion of focal renal vein thrombosis, not extending to the IVC. Left kidney appears normal. No hydronephrosis or hydroureter. Bladder is normal. Stomach/Bowel: The stomach, small bowel, and colon are not abnormally distended. Stool diffusely fills the colon. No inflammatory changes or wall thickening. Appendix is normal. Vascular/Lymphatic: There is a 1.3 cm diameter hypoechoic nodule inferior to the right kidney and new since prior study. This probably represents a metastatic lymph node. No other significant lymphadenopathy. Normal caliber abdominal aorta. No aneurysm. Reproductive: Uterus is not enlarged. Cyst in the left ovary measures 4 cm diameter, mildly enlarged since previous study. Small amount of free fluid in the pelvis is probably physiologic. Other: No free air or free fluid in the abdomen. Abdominal wall musculature appears intact. Musculoskeletal: There is a new ovoid expansile destructive rib lesion involving the left posterior tenth rib. Mildly expansile lucent lesion in the left anterior iliac spine. Heterogeneous vague sclerosis demonstrated in the right inferior ischium. A few additional tiny sclerotic foci are demonstrated in the pelvis. These lesions likely represent metastasis. Multifocal osteomyelitis would be a less likely consideration. IMPRESSION: 1. Mass in the upper pole of the right kidney measuring 4.4 x 4.7 x 6.6 cm, likely renal cell carcinoma. Suggestion of focal renal vein thrombosis, not extending to  the IVC. 2. New 1.3 cm diameter hypoechoic nodule inferior to the right kidney probably represents a metastatic lymph node. 3. New expansile destructive rib lesion involving the left posterior tenth rib. Mildly expansile lucent lesion in the left anterior iliac spine. Heterogeneous vague sclerosis in the right inferior ischium. A few additional tiny sclerotic foci in the pelvis. These lesions likely represent metastasis. Multifocal osteomyelitis would be a less likely consideration. 4. Focal infiltration in the left posterior lung base. The previously demonstrated ground-glass nodule is not included within the field of view. 5. 4 cm diameter left ovarian cyst, mildly enlarged since previous study. Small amount of free fluid in the pelvis is probably physiologic. Electronically Signed   By: Lucienne Capers M.D.   On: 05/05/2020 20:32     Medical Consultants:    None.  Anti-Infectives:   vanc cefepime  Subjective:    Kennedy Bucker she relate her dysphagia is improved.  Objective:    Vitals:   05/06/20 1418 05/06/20 1658 05/06/20 2141 05/07/20 0551  BP: 112/80 108/75 118/78 116/89  Pulse: (!) 103 (!) 104 (!) 109 90  Resp: 16 16 18 16   Temp: 99 F (37.2 C) 98.9 F (37.2 C) 98.5 F (36.9 C) 99.1 F (37.3 C)  TempSrc: Oral Oral Oral Oral  SpO2: 100% 100% 100% 98%  Weight:      Height:       SpO2: 98 %  Intake/Output Summary (Last 24 hours) at 05/07/2020 0836 Last data filed at 05/07/2020 0700 Gross per 24 hour  Intake 1673 ml  Output 1450 ml  Net 223 ml   Filed Weights   05/05/20 1314 05/06/20 0214  Weight: 52.2 kg 52 kg    Exam: General exam: In no acute distress. Respiratory system: Good air movement and clear to auscultation. Cardiovascular system: S1 & S2 heard, RRR. No JVD. Gastrointestinal system: Abdomen is nondistended, soft and nontender.  Extremities: No pedal edema. Skin: No rashes, lesions or ulcers  Data Reviewed:    Labs: Basic Metabolic  Panel: Recent Labs  Lab 05/05/20 1335 05/06/20 0318  NA 133* 136  K 3.7 3.5  CL 97* 104  CO2 26 24  GLUCOSE 98 79  BUN 13 9  CREATININE 0.79 0.80  CALCIUM 8.9 7.6*   GFR Estimated Creatinine Clearance: 75.2 mL/min (by C-G formula based on SCr of 0.8 mg/dL). Liver Function Tests: Recent Labs  Lab 05/05/20 1335 05/06/20 0318  AST 12* 8*  ALT 25 17  ALKPHOS 173* 124  BILITOT 0.9 0.7  PROT 6.5 4.9*  ALBUMIN 3.5 2.6*   Recent Labs  Lab 05/05/20 1335  LIPASE 18   No results for input(s): AMMONIA in the last 168 hours. Coagulation profile No results for input(s): INR, PROTIME in the last 168 hours. COVID-19 Labs  No results for input(s): DDIMER, FERRITIN, LDH, CRP in the last 72 hours.  Lab Results  Component Value Date   SARSCOV2NAA NEGATIVE 05/05/2020   [place] NEGATIVE 08/19/2019    CBC: Recent Labs  Lab 05/05/20 1335 05/05/20 1440 05/06/20 0318 05/06/20 1933  WBC 1.1* 1.0* 1.4*  --   NEUTROABS  --  0.1* 0.1*  --   HGB 7.3* 7.5* 5.7* 7.7*  HCT 22.7* 23.1* 17.3* 23.4*  MCV 87.0 86.8 86.5  --   PLT 44* 43* 31*  --    Cardiac Enzymes: No results for input(s): CKTOTAL, CKMB, CKMBINDEX, TROPONINI in the last 168 hours. BNP (last 3 results) No results for input(s): PROBNP in the last 8760 hours. CBG: No results for input(s): GLUCAP in the last 168 hours. D-Dimer: No results for input(s): DDIMER in the last 72 hours. Hgb A1c: No results for input(s): HGBA1C in the last 72 hours. Lipid Profile: No results for input(s): CHOL, HDL, LDLCALC, TRIG, CHOLHDL, LDLDIRECT in the last 72 hours. Thyroid function studies: No results for input(s): TSH, T4TOTAL, T3FREE, THYROIDAB in the last 72 hours.  Invalid input(s): FREET3 Anemia work up: No results for input(s): VITAMINB12, FOLATE, FERRITIN, TIBC, IRON, RETICCTPCT in the last 72 hours. Sepsis Labs: Recent Labs  Lab 05/05/20 1335 05/05/20 1440 05/05/20 1920 05/06/20 0318  WBC 1.1* 1.0*  --  1.4*   LATICACIDVEN  --   --  1.2 0.8   Microbiology Recent Results (from the past 240 hour(s))  Group A Strep by PCR     Status: None   Collection Time: 05/05/20  4:12 PM   Specimen: Throat; Sterile Swab  Result Value Ref Range Status   Group A Strep by PCR NOT DETECTED NOT DETECTED Final    Comment: Performed at [facility], [address], [place], [place] [address]  Blood culture (routine x 2)     Status: None (Preliminary result)   Collection Time: 05/05/20  7:20 PM   Specimen: BLOOD RIGHT HAND  Result Value Ref Range Status   Specimen Description BLOOD RIGHT HAND  Final   Special Requests  Final    BOTTLES DRAWN AEROBIC AND ANAEROBIC Blood Culture results may not be optimal due to an excessive volume of blood received in culture bottles   Culture   Final    NO GROWTH < 24 HOURS Performed at [facility] [address] [place], [place] [address]    Report Status PENDING  Incomplete  Blood culture (routine x 2)     Status: None (Preliminary result)   Collection Time: 05/05/20  7:25 PM   Specimen: BLOOD LEFT HAND  Result Value Ref Range Status   Specimen Description BLOOD LEFT HAND  Final   Special Requests   Final    BOTTLES DRAWN AEROBIC AND ANAEROBIC Blood Culture results may not be optimal due to an excessive volume of blood received in culture bottles   Culture   Final    NO GROWTH < 24 HOURS Performed at [facility], [place] [address], [place], [place] [address]    Report Status PENDING  Incomplete  Respiratory Panel by RT PCR (Flu A&B, Covid) - Nasopharyngeal Swab     Status: None   Collection Time: 05/05/20  7:49 PM   Specimen: Nasopharyngeal Swab  Result Value Ref Range Status   SARS Coronavirus 2 by RT PCR NEGATIVE NEGATIVE Final    Comment: (NOTE) SARS-CoV-2 target nucleic acids are NOT DETECTED.  The SARS-CoV-2 RNA is generally detectable in upper respiratoy specimens during the acute phase of infection. The lowest concentration of SARS-CoV-2  viral copies this assay can detect is 131 copies/mL. A negative result does not preclude SARS-Cov-2 infection and should not be used as the sole basis for treatment or other patient management decisions. A negative result may occur with  improper specimen collection/handling, submission of specimen other than nasopharyngeal swab, presence of viral mutation(s) within the areas targeted by this assay, and inadequate number of viral copies (<131 copies/mL). A negative result must be combined with clinical observations, patient history, and epidemiological information. The expected result is Negative.  Fact Sheet for Patients:  PinkCheek.be  Fact Sheet for Healthcare Providers:  GravelBags.it  This test is no t yet approved or cleared by the [place] FDA and  has been authorized for detection and/or diagnosis of SARS-CoV-2 by FDA under an Emergency Use Authorization (EUA). This EUA will remain  in effect (meaning this test can be used) for the duration of the COVID-19 declaration under Section 564(b)(1) of the Act, 21 U.S.C. section 360bbb-3(b)(1), unless the authorization is terminated or revoked sooner.     Influenza A by PCR NEGATIVE NEGATIVE Final   Influenza B by PCR NEGATIVE NEGATIVE Final    Comment: (NOTE) The Xpert Xpress SARS-CoV-2/FLU/RSV assay is intended as an aid in  the diagnosis of influenza from Nasopharyngeal swab specimens and  should not be used as a sole basis for treatment. Nasal washings and  aspirates are unacceptable for Xpert Xpress SARS-CoV-2/FLU/RSV  testing.  Fact Sheet for Patients: PinkCheek.be  Fact Sheet for Healthcare Providers: GravelBags.it  This test is not yet approved or cleared by the [place] FDA and  has been authorized for detection and/or diagnosis of SARS-CoV-2 by  FDA under an Emergency Use Authorization (EUA).  This EUA will remain  in effect (meaning this test can be used) for the duration of the  Covid-19 declaration under Section 564(b)(1) of the Act, 21  U.S.C. section 360bbb-3(b)(1), unless the authorization is  terminated or revoked. Performed at [facility], [place],  [place] [address]      Medications:   . buPROPion  150 mg Oral Daily  . pantoprazole  40 mg Oral Daily   Continuous Infusions: . ceFEPime (MAXIPIME) IV Stopped (05/07/20 6734)  . fluconazole (DIFLUCAN) IV 100 mg (05/07/20 0831)  . vancomycin 750 mg (05/06/20 2242)      LOS: 1 day   Charlynne Cousins  [facility]  05/07/2020, 8:36 AM

## 2020-05-08 DIAGNOSIS — R5081 Fever presenting with conditions classified elsewhere: Secondary | ICD-10-CM | POA: Diagnosis not present

## 2020-05-08 DIAGNOSIS — B370 Candidal stomatitis: Secondary | ICD-10-CM | POA: Diagnosis not present

## 2020-05-08 DIAGNOSIS — D709 Neutropenia, unspecified: Secondary | ICD-10-CM | POA: Diagnosis not present

## 2020-05-08 LAB — CBC WITH DIFFERENTIAL/PLATELET
Abs Immature Granulocytes: 0.76 10*3/uL — ABNORMAL HIGH (ref 0.00–0.07)
Basophils Absolute: 0 10*3/uL (ref 0.0–0.1)
Basophils Relative: 0 %
Eosinophils Absolute: 0 10*3/uL (ref 0.0–0.5)
Eosinophils Relative: 0 %
HCT: 24 % — ABNORMAL LOW (ref 36.0–46.0)
Hemoglobin: 7.9 g/dL — ABNORMAL LOW (ref 12.0–15.0)
Immature Granulocytes: 14 %
Lymphocytes Relative: 35 %
Lymphs Abs: 1.9 10*3/uL (ref 0.7–4.0)
MCH: 28.9 pg (ref 26.0–34.0)
MCHC: 32.9 g/dL (ref 30.0–36.0)
MCV: 87.9 fL (ref 80.0–100.0)
Monocytes Absolute: 0.7 10*3/uL (ref 0.1–1.0)
Monocytes Relative: 13 %
Neutro Abs: 2 10*3/uL (ref 1.7–7.7)
Neutrophils Relative %: 38 %
Platelets: 24 10*3/uL — CL (ref 150–400)
RBC: 2.73 MIL/uL — ABNORMAL LOW (ref 3.87–5.11)
RDW: 21.7 % — ABNORMAL HIGH (ref 11.5–15.5)
WBC: 5.4 10*3/uL (ref 4.0–10.5)
nRBC: 0.4 % — ABNORMAL HIGH (ref 0.0–0.2)

## 2020-05-08 LAB — PATHOLOGIST SMEAR REVIEW

## 2020-05-08 MED ORDER — FLUCONAZOLE 100 MG PO TABS
100.0000 mg | ORAL_TABLET | Freq: Every day | ORAL | Status: DC
  Administered 2020-05-09: 100 mg via ORAL
  Filled 2020-05-08 (×2): qty 1

## 2020-05-08 MED ORDER — CEFDINIR 300 MG PO CAPS
300.0000 mg | ORAL_CAPSULE | Freq: Two times a day (BID) | ORAL | Status: DC
  Administered 2020-05-08 – 2020-05-09 (×3): 300 mg via ORAL
  Filled 2020-05-08 (×3): qty 1

## 2020-05-08 MED ORDER — DOXYCYCLINE HYCLATE 100 MG PO TABS
100.0000 mg | ORAL_TABLET | Freq: Two times a day (BID) | ORAL | Status: DC
  Administered 2020-05-08 – 2020-05-09 (×3): 100 mg via ORAL
  Filled 2020-05-08 (×3): qty 1

## 2020-05-08 NOTE — Progress Notes (Addendum)
[facility] PROGRESS NOTE    Progress Note  Katrina Lewis  JKD:[id number] DOB: [birth year] DOA: 05/05/2020 PCP: Lujean Amel, MD     Brief Narrative:   Katrina Lewis is an 44 y.o. 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  Her ANC was 100. Culture data have remained negative till date, she has remained afebrile tachycardia has resolved. CT scan of the abdomen and pelvis showed no acute findings she will need to follow-up with oncology as an outpatient but it did showed focal renal vein thrombosis not extending into the IVC.  And a new expansive destructive rib lesion in the left posterior 10th rib Continue IV empiric antibiotics, CBC with differential is pending this morning.  Chemotherapy-induced anemia: She is status post 2 units of packed red blood cells recheck hemoglobin was 7.7. CBC with differential is pending this morning.  Chemotherapy-induced thrombocytopenia: Likely due to chemotherapy, plus or minus IV vancomycin, discontinue IV vancomycin. Continue to hold anticoagulation for DVT prophylaxis, place her in SCDs. Check a CBC tomorrow morning.  Poorly differentiated metastatic urothelial carcinoma of the renal pelvis: With last chemotherapy on 04/26/2020 there is likely the cause of her neutropenia.  New CT evidence of focal involvement with renal vein thrombosis: Noted, follow-up with oncology at [facility] as an  outpatient. He is currently asymptomatic.  Oral thrush: Her swallowing has improved change in function to oral. She is tolerating her diet.  Autoimmune hepatitis ([place]) Noted.  DVT prophylaxis: lovenox Family Communication:none Status is: Observation  The patient will require care spanning > 2 midnights and should be moved to inpatient because: Hemodynamically unstable  Dispo: The patient is from: Home              Anticipated d/c is to: Home              Anticipated d/c date is: 1days              Patient currently is not medically stable to d/c.    Code Status:     Code Status Orders  (From admission, onward)         Start     Ordered   05/05/20 1952  Full code  Continuous        05/05/20 2003        Code Status History    Date Active Date Inactive Code Status Order ID Comments User Context   08/19/2019 0828 08/22/2019 2039 Full Code [id number]  Norval Morton, MD ED   Advance Care Planning Activity        IV Access:    Peripheral IV   Procedures and diagnostic studies:   No results found.   Medical Consultants:    None.  Anti-Infectives:   vanc cefepime  Subjective:    Katrina Lewis her dysphagia is resolved.  Objective:    Vitals:   05/07/20 2011 05/08/20 0052 05/08/20 0351 05/08/20 0700  BP: 112/74 113/82 109/76 119/81  Pulse: (!) 101 98 91 (!) 105  Resp: 17 12 15 16   Temp: 99 F (37.2 C) 98.4 F (  36.9 C) 98.5 F (36.9 C) 98.4 F (36.9 C)  TempSrc: Oral Oral Oral Oral  SpO2: 100% 99% 100%   Weight:      Height:       SpO2: 100 %   Intake/Output Summary (Last 24 hours) at 05/08/2020 0908 Last data filed at 05/08/2020 0757 Gross per 24 hour  Intake 910 ml  Output 350 ml  Net 560 ml   Filed Weights   05/05/20 1314 05/06/20 0214  Weight: 52.2 kg 52 kg    Exam: General exam: In no acute distress. Respiratory system: Good air movement and clear to auscultation. Cardiovascular system: S1 & S2 heard, RRR. No  JVD. Gastrointestinal system: Abdomen is nondistended, soft and nontender.  Extremities: No pedal edema. Skin: No rashes, lesions or ulcers  Data Reviewed:    Labs: Basic Metabolic Panel: Recent Labs  Lab 05/05/20 1335 05/06/20 0318  NA 133* 136  K 3.7 3.5  CL 97* 104  CO2 26 24  GLUCOSE 98 79  BUN 13 9  CREATININE 0.79 0.80  CALCIUM 8.9 7.6*   GFR Estimated Creatinine Clearance: 75.2 mL/min (by C-G formula based on SCr of 0.8 mg/dL). Liver Function Tests: Recent Labs  Lab 05/05/20 1335 05/06/20 0318  AST 12* 8*  ALT 25 17  ALKPHOS 173* 124  BILITOT 0.9 0.7  PROT 6.5 4.9*  ALBUMIN 3.5 2.6*   Recent Labs  Lab 05/05/20 1335  LIPASE 18   No results for input(s): AMMONIA in the last 168 hours. Coagulation profile No results for input(s): INR, PROTIME in the last 168 hours. COVID-19 Labs  No results for input(s): DDIMER, FERRITIN, LDH, CRP in the last 72 hours.  Lab Results  Component Value Date   SARSCOV2NAA NEGATIVE 05/05/2020   [name] NEGATIVE 08/19/2019    CBC: Recent Labs  Lab 05/05/20 1335 05/05/20 1440 05/06/20 0318 05/06/20 1933 05/08/20 0304  WBC 1.1* 1.0* 1.4*  --  5.4  NEUTROABS  --  0.1* 0.1*  --  2.0  HGB 7.3* 7.5* 5.7* 7.7* 7.9*  HCT 22.7* 23.1* 17.3* 23.4* 24.0*  MCV 87.0 86.8 86.5  --  87.9  PLT 44* 43* 31*  --  24*   Cardiac Enzymes: No results for input(s): CKTOTAL, CKMB, CKMBINDEX, TROPONINI in the last 168 hours. BNP (last 3 results) No results for input(s): PROBNP in the last 8760 hours. CBG: No results for input(s): GLUCAP in the last 168 hours. D-Dimer: No results for input(s): DDIMER in the last 72 hours. Hgb A1c: No results for input(s): HGBA1C in the last 72 hours. Lipid Profile: No results for input(s): CHOL, HDL, LDLCALC, TRIG, CHOLHDL, LDLDIRECT in the last 72 hours. Thyroid function studies: No results for input(s): TSH, T4TOTAL, T3FREE, THYROIDAB in the last 72 hours.  Invalid input(s): FREET3 Anemia  work up: No results for input(s): VITAMINB12, FOLATE, FERRITIN, TIBC, IRON, RETICCTPCT in the last 72 hours. Sepsis Labs: Recent Labs  Lab 05/05/20 1335 05/05/20 1440 05/05/20 1920 05/06/20 0318 05/08/20 0304  WBC 1.1* 1.0*  --  1.4* 5.4  LATICACIDVEN  --   --  1.2 0.8  --    Microbiology Recent Results (from the past 240 hour(s))  Group A Strep by PCR     Status: None   Collection Time: 05/05/20  4:12 PM   Specimen: Throat; Sterile Swab  Result Value Ref Range Status   Group A Strep by PCR NOT DETECTED NOT DETECTED Final    Comment: Performed at [facility], [address] [place], [place] [address]  Blood culture (routine x 2)     Status: None (Preliminary result)   Collection Time: 05/05/20  7:20 PM   Specimen: BLOOD RIGHT HAND  Result Value Ref Range Status   Specimen Description BLOOD RIGHT HAND  Final   Special Requests   Final    BOTTLES DRAWN AEROBIC AND ANAEROBIC Blood Culture results may not be optimal due to an excessive volume of blood received in culture bottles   Culture   Final    NO GROWTH 3 DAYS Performed at [facility], [place] [address], [place], [place] [address]    Report Status PENDING  Incomplete  Blood culture (routine x 2)     Status: None (Preliminary result)   Collection Time: 05/05/20  7:25 PM   Specimen: BLOOD LEFT HAND  Result Value Ref Range Status   Specimen Description BLOOD LEFT HAND  Final   Special Requests   Final    BOTTLES DRAWN AEROBIC AND ANAEROBIC Blood Culture results may not be optimal due to an excessive volume of blood received in culture bottles   Culture   Final    NO GROWTH 3 DAYS Performed at [facility], [place] [address], [place], [place] [address]    Report Status PENDING  Incomplete  Respiratory Panel by RT PCR (Flu A&B, Covid) - Nasopharyngeal Swab     Status: None   Collection Time: 05/05/20  7:49 PM   Specimen: Nasopharyngeal Swab  Result Value Ref Range Status   SARS Coronavirus 2 by RT PCR  NEGATIVE NEGATIVE Final    Comment: (NOTE) SARS-CoV-2 target nucleic acids are NOT DETECTED.  The SARS-CoV-2 RNA is generally detectable in upper respiratoy specimens during the acute phase of infection. The lowest concentration of SARS-CoV-2 viral copies this assay can detect is 131 copies/mL. A negative result does not preclude SARS-Cov-2 infection and should not be used as the sole basis for treatment or other patient management decisions. A negative result may occur with  improper specimen collection/handling, submission of specimen other than nasopharyngeal swab, presence of viral mutation(s) within the areas targeted by this assay, and inadequate number of viral copies (<131 copies/mL). A negative result must be combined with clinical observations, patient history, and epidemiological information. The expected result is Negative.  Fact Sheet for Patients:  PinkCheek.be  Fact Sheet for Healthcare Providers:  GravelBags.it  This test is no t yet approved or cleared by the [place] FDA and  has been authorized for detection and/or diagnosis of SARS-CoV-2 by FDA under an Emergency Use Authorization (EUA). This EUA will remain  in effect (meaning this test can be used) for the duration of the COVID-19 declaration under Section 564(b)(1) of the Act, 21 U.S.C. section 360bbb-3(b)(1), unless the authorization is terminated or revoked sooner.     Influenza A by PCR NEGATIVE NEGATIVE Final   Influenza B by PCR NEGATIVE NEGATIVE Final    Comment: (NOTE) The Xpert Xpress SARS-CoV-2/FLU/RSV assay is intended as an aid in  the diagnosis of influenza from Nasopharyngeal swab specimens and  should not be used as a sole basis for treatment. Nasal washings and  aspirates are unacceptable for Xpert Xpress SARS-CoV-2/FLU/RSV  testing.  Fact Sheet for Patients: PinkCheek.be  Fact Sheet for  Healthcare Providers: GravelBags.it  This test is not yet approved or cleared by the [place] FDA and  has been authorized for detection and/or diagnosis of SARS-CoV-2 by  FDA under an Emergency Use  Authorization (EUA). This EUA will remain  in effect (meaning this test can be used) for the duration of the  Covid-19 declaration under Section 564(b)(1) of the Act, 21  U.S.C. section 360bbb-3(b)(1), unless the authorization is  terminated or revoked. Performed at [facility], [place] [address] [place], [place] [address]      Medications:   . buPROPion  150 mg Oral Daily  . feeding supplement  237 mL Oral BID BM  . multivitamin with minerals  1 tablet Oral Daily  . pantoprazole  40 mg Oral Daily   Continuous Infusions: . ceFEPime (MAXIPIME) IV 2 g (05/08/20 0649)  . fluconazole (DIFLUCAN) IV 100 mg (05/08/20 0834)  . vancomycin 750 mg (05/07/20 2235)      LOS: 2 days   Charlynne Cousins  [facility]  05/08/2020, 9:08 AM

## 2020-05-08 NOTE — Progress Notes (Addendum)
CRITICAL VALUE STICKER  CRITICAL VALUE: Platelets, 24  RECEIVER (on-site recipient of call): [place] NOTIFIED: 10/25 0220  MESSENGER (representative from lab): Saralyn Pilar  MD NOTIFIED: Hal Hope  TIME OF NOTIFICATION: [phone]  RESPONSE: monitor for and inform MD of bleeding

## 2020-05-08 NOTE — Progress Notes (Signed)
Patient refuses daily standing weight. Offered a stated weight derived from memory of previous standing weight.

## 2020-05-09 DIAGNOSIS — K754 Autoimmune hepatitis: Secondary | ICD-10-CM

## 2020-05-09 DIAGNOSIS — R000 Tachycardia, unspecified: Secondary | ICD-10-CM | POA: Diagnosis not present

## 2020-05-09 DIAGNOSIS — D709 Neutropenia, unspecified: Secondary | ICD-10-CM

## 2020-05-09 DIAGNOSIS — R5081 Fever presenting with conditions classified elsewhere: Secondary | ICD-10-CM | POA: Diagnosis not present

## 2020-05-09 LAB — CBC WITH DIFFERENTIAL/PLATELET
Abs Immature Granulocytes: 1.58 10*3/uL — ABNORMAL HIGH (ref 0.00–0.07)
Basophils Absolute: 0 10*3/uL (ref 0.0–0.1)
Basophils Relative: 0 %
Eosinophils Absolute: 0 10*3/uL (ref 0.0–0.5)
Eosinophils Relative: 0 %
HCT: 24.6 % — ABNORMAL LOW (ref 36.0–46.0)
Hemoglobin: 8.2 g/dL — ABNORMAL LOW (ref 12.0–15.0)
Immature Granulocytes: 19 %
Lymphocytes Relative: 24 %
Lymphs Abs: 2 10*3/uL (ref 0.7–4.0)
MCH: 29.6 pg (ref 26.0–34.0)
MCHC: 33.3 g/dL (ref 30.0–36.0)
MCV: 88.8 fL (ref 80.0–100.0)
Monocytes Absolute: 0.9 10*3/uL (ref 0.1–1.0)
Monocytes Relative: 11 %
Neutro Abs: 3.9 10*3/uL (ref 1.7–7.7)
Neutrophils Relative %: 46 %
Platelets: 41 10*3/uL — ABNORMAL LOW (ref 150–400)
RBC: 2.77 MIL/uL — ABNORMAL LOW (ref 3.87–5.11)
RDW: 21.8 % — ABNORMAL HIGH (ref 11.5–15.5)
WBC: 8.4 10*3/uL (ref 4.0–10.5)
nRBC: 0.5 % — ABNORMAL HIGH (ref 0.0–0.2)

## 2020-05-09 MED ORDER — ENSURE ENLIVE PO LIQD
237.0000 mL | Freq: Two times a day (BID) | ORAL | Status: DC
Start: 2020-05-09 — End: 2022-10-24

## 2020-05-09 MED ORDER — ADULT MULTIVITAMIN W/MINERALS CH: 1.0000 | ORAL_TABLET | Freq: Every day | ORAL | Status: DC

## 2020-05-09 MED ORDER — CEFDINIR 300 MG PO CAPS: 300.0000 mg | ORAL_CAPSULE | Freq: Two times a day (BID) | ORAL | 0 refills | Status: AC

## 2020-05-09 MED ORDER — DOXYCYCLINE HYCLATE 100 MG PO TABS: 100.0000 mg | ORAL_TABLET | Freq: Two times a day (BID) | ORAL | 0 refills | Status: AC

## 2020-05-09 NOTE — Plan of Care (Signed)

## 2020-05-09 NOTE — Discharge Instructions (Signed)
Neutropenic Fever Neutropenic fever is a type of fever that can develop when you have a very low number of a certain kind of white blood cells called neutrophils. This condition is called neutropenia. Neutrophils are made in the spongy tissue inside the bones (bone marrow), and they help the body fight infection. When you have neutropenia, you could be in danger of a severe infection and neutropenic fever. Neutropenic fever must be treated right away with antibiotic medicines. What are the causes? This condition is caused by damage to the bone marrow or damage to neutrophils after they leave the bone marrow. Causes of neutropenia may include:  Cancer treatments.  Bone marrow cancer.  Cancer of the white blood cells (leukemia or myeloma).  Severe infection.  Bone marrow failure (aplastic anemia).  Many types of medicines.  Conditions that affect the body's disease-fighting system (autoimmune diseases).  Genes that are passed from parent to child (inherited).  Lack (deficiency) of vitamin B.  Enlarged spleen in rheumatoid arthritis (Felty syndrome). What are the signs or symptoms? The main symptom of this condition is fever. Other symptoms include:  Chills.  Fatigue.  Painful mouth ulcers.  Cough.  Shortness of breath.  Swollen glands (lymph nodes).  Sore throat.  Sinus and ear infections.  Gum disease.  Skin infection.  Burning and frequent urination.  Rectal infections.  Vaginal discharge or itching.  Body aches. How is this diagnosed? You may be diagnosed with neutropenic fever if you have a neutrophil count of less than 500 neutrophils per microliter of blood and a fever of at least 100.89F (38.0C). You may have tests, such as:  Blood tests. These may include: ? A complete blood count (CBC) and a differential white blood count (WBC). ? Peripheral smear. This test involves checking a blood sample under a microscope.  Tests to see whether germs grow from  samples of your blood, urine, or other body fluids (cultures). These may be done to check for a source of infection.  Chest X-rays. Your health care provider will also determine whether your neutropenic fever is high risk or low risk.  You may have high-risk neutropenic fever if: ? Your neutrophil count is less than 100 neutrophils per microliter of blood. ? You have also been diagnosed with pneumonia or another serious medical problem or infection. ? Your condition requires you to be treated in the hospital. ? You are older than 60 years.  You may have low-risk neutropenic fever if: ? Your neutrophil count is more than 100 neutrophils per microliter of blood. ? Your chest X-ray is normal. ? You do not have an active illness or any other problems that require you to be in the hospital. How is this treated? Treatment for this condition may be started as soon as you get diagnosed with neutropenic fever, even if your health care provider is still looking for the source of infection.  You may have to stop taking any medicine that could be causing neutropenic fever.  If you have high-risk neutropenic fever, you will receive one or more antibiotics through an IV in the hospital.  If you have low-risk neutropenic fever, you may be treated at home. Treatment may involve: ? Taking one or two antibiotics by mouth (orally). ? Receiving IV antibiotics that are given by a health care provider who visits your home.  If your health care provider finds a specific cause of infection, you may be switched to antibiotics that work best against those bacteria. If a fungal infection  is found, your medicine will be changed to an antifungal medicine.  If your fever goes away in 3-5 days, you may have to take medicine for about 7 days. If your fever does not go away after 3-5 days, you may have to take medicine for a longer period.  If your fever is caused by cancer medicines (chemotherapy), you may need to  take a certain medicine (white blood cell growth factors) that helps prevent fever. Follow these instructions at home: Preventing infection   Take steps to prevent infections: ? Avoid contact with sick people. ? Do not eat uncooked or undercooked meats. ? Wash all fruits and vegetables before eating. ? Do not eat or drink unpasteurized dairy products. ? Get regular dental care, and maintain good dental hygiene. ? Get a flu shot (influenza vaccine). Ask your health care provider whether you need any other vaccines. ? Wear gloves when gardening. ? Wash your hands often with soap and water. If soap and water are not available, use hand sanitizer. General instructions  Take over-the-counter and prescription medicines only as told by your health care provider.  Take your antibiotic medicine as told by your health care provider. Do not stop taking the antibiotic even if you start to feel better.  Drink enough fluid to keep your urine pale yellow. This helps to prevent dehydration.  Keep all follow-up visits as told by your health care provider. This is important. If you are being treated with oral antibiotics at home, you may need to return to your health care provider every day to have your CBC checked. You may have to do this until your fever gets better. Contact a health care provider if:  You have chills.  You have a fever.  You have signs or symptoms of an infection. Get help right away if:  You have trouble breathing.  You have chest pain.  You feel faint or dizzy.  You faint. Summary  Neutropenic fever is a type of fever that can develop when you have a very low number of white blood cells called neutrophils.  Neutropenic fever must be treated right away with antibiotic medicines.  Causes of this condition include cancers of the blood and bone marrow, as well as medicines to treat cancer (chemotherapy).  Take steps to reduce your risk of infection. These may include  avoiding contact with sick people, cooking all meats thoroughly, washing fruits and vegetables before eating, and washing hands often with soap and water. This information is not intended to replace advice given to you by your health care provider. Make sure you discuss any questions you have with your health care provider. Document Revised: 06/12/2017 Document Reviewed: 10/07/2016 Elsevier Patient Education  [place].

## 2020-05-09 NOTE — Progress Notes (Signed)
D/C instructions given and reviewed. No questions asked but encouraged to call with any concerns. IV removed, tolerated well. 

## 2020-05-09 NOTE — Discharge Summary (Signed)
Physician Discharge Summary  Katrina Lewis MHD:[id number] DOB: Jul 21, 1975 DOA: 05/05/2020  PCP: Katrina Amel, MD  Admit date: 05/05/2020 Discharge date: 05/09/2020  Time spent: 45 minutes  Recommendations for Outpatient Follow-up:  Patient will be discharged to home.  Patient will need to follow up with primary care provider and oncologist within one week of discharge, repeat CBC.  Patient should continue medications as prescribed.  Patient should follow a regular diet.   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 Rupert Lewis Katrina Lewis is a 44 y.o. female with medical history significant for autoimmune hepatitis, ADD, seronegative AIH/autoimmune cholangiopathy, former tobacco smoker, hypercholesterolemia, malignant neoplasm of the urinary bladder currently undergoing neoadjuvant chemotherapy (ddMVAC, started 03/01/2020 with one more treatment remaining) - previous chemo treatment on 04/26/20, hypercalcemia of malignancy with osseous metastatic disease status post IVF and sulfonic acid presented to the ED for chief concern of nausea and vomiting since Wednesday, 05/06/2020.  She states that since that time she has vomited at least 12-14 times.  No blood.  She denies fever.  She endorses chills.  Denies cough, vision changes, chest pain, shortness of breath, abdominal pain, diarrhea, loss of taste and smell, insomnia.  She reports that she generally feels little weak and endorses sore throat.  Social history: Katrina Lewis lives at home by herself and able to perform  her daily ADLs.  She states her stepmother takes her to chemo treatment.  She formally used tobacco products, approximately half a pack per day since her teenage years and quit in July 2021, infrequent EtOH use, and endorses nightly THC use since chemotherapy treatment and denies IV drug use.  Hospital Course:  Neutropenic fever -Suspect secondary to chemotherapy -ANC was 100 -Blood cultures remain negative to date -Patient has remained afebrile for the past 48 hours, tachycardia has also resolved -CT of the abdomen pelvis showed no acute findings however did show focal renal vein thrombosis, not extending into the IVC. New 1.3 cm diameter hypoechoic nodule inferior to the right kidney probably represents a metastatic lymph node. New expansile destructive rib lesion involving left posterior 10th rib and left anterior iliac spine. 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 -Last chemotherapy was on 04/26/2020 -Patient will follow up with her oncologist  New CT evidence of focal involvement, with renal vein thrombosis -Patient to follow-up with oncology as an outpatient -Currently asymptomatic  Oral thrush -Likely secondary to the above -Continue fluconazole  Autoimmune hepatitis -LFTs stable  Procedures: None  Consultations: None  Discharge Exam: Vitals:   05/08/20 2027 05/09/20 0459  BP: 108/71 120/78  Pulse: 99 93  Resp: 18 18  Temp: 98.5 F (36.9 C) 98.5 F (36.9 C)  SpO2: 100% 100%     General: Well developed, well nourished, NAD, appears stated  age  35: NCAT, mucous membranes moist.  Cardiovascular: S1 S2 auscultated, RRR  Respiratory: Clear to auscultation bilaterally  Abdomen: Soft, nontender, nondistended, + bowel sounds  Extremities: warm dry without cyanosis clubbing or edema  Neuro: AAOx3, nonfocal  Psych: Pleasant, appropriate mood and affect  Discharge Instructions Discharge Instructions    Discharge instructions   Complete by: As directed    Patient will be discharged to home.  Patient will need to follow up with primary care provider and oncologist within one week of discharge, repeat CBC.  Patient should continue medications as prescribed.  Patient should follow a regular diet.     Allergies as of 05/09/2020   No Known Allergies     Medication List    TAKE these medications   amphetamine-dextroamphetamine 20 MG tablet Commonly known as: ADDERALL Take 10-20 mg by mouth See admin instructions. Take 20 mg by mouth in the morning and 10 mg in the afternoon   Bayer Low Dose 81 MG EC tablet Generic drug: aspirin Take 81 mg by mouth in the morning. Swallow whole.   cefdinir 300 MG capsule Commonly known as: OMNICEF Take 1 capsule (300 mg total) by mouth every 12 (twelve) hours for 2 days.   dexamethasone 4 MG tablet Commonly known as: DECADRON Take 8 mg by mouth See admin instructions. "Take 8 mg (2 tablets) by mouth daily on days 2, 3, and 4"   doxycycline 100 MG tablet Commonly known as: VIBRA-TABS Take 1 tablet (100 mg total) by mouth every 12 (twelve) hours for 2 days.   feeding supplement Liqd Take 237 mLs by mouth 2 (two) times daily between meals.   fluconazole 100 MG tablet Commonly known as: DIFLUCAN Take 100 mg by mouth daily.   multivitamin with minerals Tabs tablet Take 1 tablet by mouth daily.   nystatin 100000 UNIT/ML suspension Commonly known as: MYCOSTATIN Take 5 mLs by mouth See admin instructions. 5 ml's AS DIRECTED four times a day   OLANZapine 5 MG tablet Commonly  known as: ZYPREXA Take 5 mg by mouth See admin instructions. "Take 5 mg by mouth at bedtime- start the day after chemotherapy and continue for three days"   ondansetron 8 MG tablet Commonly known as: ZOFRAN Take 8 mg by mouth every 8 (eight) hours as needed for nausea or vomiting.   pantoprazole 40 MG tablet Commonly known as: Protonix Take 1 tablet (40 mg total) by mouth daily.   prochlorperazine 10 MG tablet Commonly known as: COMPAZINE Take 10 mg by mouth every 6 (six) hours as needed for nausea or vomiting.   Wellbutrin XL 150 MG 24 hr tablet Generic drug: buPROPion Take 150 mg by mouth daily.      No Known Allergies  Follow-up Information    Koirala, Dibas, MD. Schedule an appointment as soon as possible for a visit in 3 days.   Specialty: Family Medicine Why: for recheck Contact information: [address] [place] [address] [phone]                The results of significant diagnostics from this hospitalization (including imaging, microbiology, ancillary and laboratory) are listed below for reference.    Significant Diagnostic Studies: X-ray chest PA and lateral  Result Date: 05/05/2020 CLINICAL DATA:  Neutropenic fever and vomiting EXAM: CHEST - 2 VIEW COMPARISON:  08/19/2019 FINDINGS: Cardiac shadow is within normal limits. Right chest wall port is noted in satisfactory position new from the prior exam. No focal infiltrate or  sizable effusion is seen. No bony abnormality is noted. IMPRESSION: No active cardiopulmonary disease. Electronically Signed   By: Inez Catalina M.D.   On: 05/05/2020 21:06   CT ABDOMEN PELVIS W CONTRAST  Result Date: 05/05/2020 CLINICAL DATA:  Fever. Abdominal surgery. Abdominal abscess or infection is suspected. EXAM: CT ABDOMEN AND PELVIS WITH CONTRAST TECHNIQUE: Multidetector CT imaging of the abdomen and pelvis was performed using the standard protocol following bolus administration of intravenous contrast.  CONTRAST:  112mL OMNIPAQUE IOHEXOL 300 MG/ML  SOLN COMPARISON:  12/09/2019 FINDINGS: Lower chest: Focal infiltration in the left posterior lung base. The previously demonstrated ground-glass nodule is not included within the field of view. Hepatobiliary: No focal liver abnormality is seen. No gallstones, gallbladder wall thickening, or biliary dilatation. Pancreas: Unremarkable. No pancreatic ductal dilatation or surrounding inflammatory changes. Spleen: Normal in size without focal abnormality. Adrenals/Urinary Tract: No adrenal gland nodules. Mass in the upper pole of the right kidney measuring 4.4 x 4.7 x 6.6 Cm. This is likely renal cell carcinoma. Mild infiltration into the surrounding fat. Suggestion of focal renal vein thrombosis, not extending to the IVC. Left kidney appears normal. No hydronephrosis or hydroureter. Bladder is normal. Stomach/Bowel: The stomach, small bowel, and colon are not abnormally distended. Stool diffusely fills the colon. No inflammatory changes or wall thickening. Appendix is normal. Vascular/Lymphatic: There is a 1.3 cm diameter hypoechoic nodule inferior to the right kidney and new since prior study. This probably represents a metastatic lymph node. No other significant lymphadenopathy. Normal caliber abdominal aorta. No aneurysm. Reproductive: Uterus is not enlarged. Cyst in the left ovary measures 4 cm diameter, mildly enlarged since previous study. Small amount of free fluid in the pelvis is probably physiologic. Other: No free air or free fluid in the abdomen. Abdominal wall musculature appears intact. Musculoskeletal: There is a new ovoid expansile destructive rib lesion involving the left posterior tenth rib. Mildly expansile lucent lesion in the left anterior iliac spine. Heterogeneous vague sclerosis demonstrated in the right inferior ischium. A few additional tiny sclerotic foci are demonstrated in the pelvis. These lesions likely represent metastasis. Multifocal  osteomyelitis would be a less likely consideration. IMPRESSION: 1. Mass in the upper pole of the right kidney measuring 4.4 x 4.7 x 6.6 cm, likely renal cell carcinoma. Suggestion of focal renal vein thrombosis, not extending to the IVC. 2. New 1.3 cm diameter hypoechoic nodule inferior to the right kidney probably represents a metastatic lymph node. 3. New expansile destructive rib lesion involving the left posterior tenth rib. Mildly expansile lucent lesion in the left anterior iliac spine. Heterogeneous vague sclerosis in the right inferior ischium. A few additional tiny sclerotic foci in the pelvis. These lesions likely represent metastasis. Multifocal osteomyelitis would be a less likely consideration. 4. Focal infiltration in the left posterior lung base. The previously demonstrated ground-glass nodule is not included within the field of view. 5. 4 cm diameter left ovarian cyst, mildly enlarged since previous study. Small amount of free fluid in the pelvis is probably physiologic. Electronically Signed   By: Lucienne Capers M.D.   On: 05/05/2020 20:32    Microbiology: Recent Results (from the past 240 hour(s))  Group A Strep by PCR     Status: None   Collection Time: 05/05/20  4:12 PM   Specimen: Throat; Sterile Swab  Result Value Ref Range Status   Group A Strep by PCR NOT DETECTED NOT DETECTED Final    Comment: Performed at [facility], [place]  [address] [place], [place] [address]  Blood culture (routine x 2)     Status: None (Preliminary result)   Collection Time: 05/05/20  7:20 PM   Specimen: BLOOD RIGHT HAND  Result Value Ref Range Status   Specimen Description BLOOD RIGHT HAND  Final   Special Requests   Final    BOTTLES DRAWN AEROBIC AND ANAEROBIC Blood Culture results may not be optimal due to an excessive volume of blood received in culture bottles   Culture   Final    NO GROWTH 4 DAYS Performed at [facility], [place] [address], [place],  [address]    Report  Status PENDING  Incomplete  Blood culture (routine x 2)     Status: None (Preliminary result)   Collection Time: 05/05/20  7:25 PM   Specimen: BLOOD LEFT HAND  Result Value Ref Range Status   Specimen Description BLOOD LEFT HAND  Final   Special Requests   Final    BOTTLES DRAWN AEROBIC AND ANAEROBIC Blood Culture results may not be optimal due to an excessive volume of blood received in culture bottles   Culture   Final    NO GROWTH 4 DAYS Performed at [facility], [place] [address], [place],  [address]    Report Status PENDING  Incomplete  Respiratory Panel by RT PCR (Flu A&B, Covid) - Nasopharyngeal Swab     Status: None   Collection Time: 05/05/20  7:49 PM   Specimen: Nasopharyngeal Swab  Result Value Ref Range Status   SARS Coronavirus 2 by RT PCR NEGATIVE NEGATIVE Final    Comment: (NOTE) SARS-CoV-2 target nucleic acids are NOT DETECTED.  The SARS-CoV-2 RNA is generally detectable in upper respiratoy specimens during the acute phase of infection. The lowest concentration of SARS-CoV-2 viral copies this assay can detect is 131 copies/mL. A negative result does not preclude SARS-Cov-2 infection and should not be used as the sole basis for treatment or other patient management decisions. A negative result may occur with  improper specimen collection/handling, submission of specimen other than nasopharyngeal swab, presence of viral mutation(s) within the areas targeted by this assay, and inadequate number of viral copies (<131 copies/mL). A negative result must be combined with clinical observations, patient history, and epidemiological information. The expected result is Negative.  Fact Sheet for Patients:  PinkCheek.be  Fact Sheet for Healthcare Providers:  GravelBags.it  This test is no t yet approved or cleared by the [place] FDA and  has been authorized for detection and/or diagnosis of  SARS-CoV-2 by FDA under an Emergency Use Authorization (EUA). This EUA will remain  in effect (meaning this test can be used) for the duration of the COVID-19 declaration under Section 564(b)(1) of the Act, 21 U.S.C. section 360bbb-3(b)(1), unless the authorization is terminated or revoked sooner.     Influenza A by PCR NEGATIVE NEGATIVE Final   Influenza B by PCR NEGATIVE NEGATIVE Final    Comment: (NOTE) The Xpert Xpress SARS-CoV-2/FLU/RSV assay is intended as an aid in  the diagnosis of influenza from Nasopharyngeal swab specimens and  should not be used as a sole basis for treatment. Nasal washings and  aspirates are unacceptable for Xpert Xpress SARS-CoV-2/FLU/RSV  testing.  Fact Sheet for Patients: PinkCheek.be  Fact Sheet for Healthcare Providers: GravelBags.it  This test is not yet approved or cleared by the [place] FDA and  has been authorized for detection and/or diagnosis of SARS-CoV-2 by  FDA under an Emergency Use Authorization (EUA).  This EUA will remain  in effect (meaning this test can be used) for the duration of the  Covid-19 declaration under Section 564(b)(1) of the Act, 21  U.S.C. section 360bbb-3(b)(1), unless the authorization is  terminated or revoked. Performed at [facility], [place] [address] [place],  [address]      Labs: Basic Metabolic Panel: Recent Labs  Lab 05/05/20 1335 05/06/20 0318  NA 133* 136  K 3.7 3.5  CL 97* 104  CO2 26 24  GLUCOSE 98 79  BUN 13 9  CREATININE 0.79 0.80  CALCIUM 8.9 7.6*   Liver Function Tests: Recent Labs  Lab 05/05/20 1335 05/06/20 0318  AST 12* 8*  ALT 25 17  ALKPHOS 173* 124  BILITOT 0.9 0.7  PROT 6.5 4.9*  ALBUMIN 3.5 2.6*   Recent Labs  Lab 05/05/20 1335  LIPASE 18   No results for input(s): AMMONIA in the last 168 hours. CBC: Recent Labs  Lab 05/05/20 1335 05/05/20 1335 05/05/20 1440 05/06/20 0318  05/06/20 1933 05/08/20 0304 05/09/20 0208  WBC 1.1*  --  1.0* 1.4*  --  5.4 8.4  NEUTROABS  --   --  0.1* 0.1*  --  2.0 3.9  HGB 7.3*   < > 7.5* 5.7* 7.7* 7.9* 8.2*  HCT 22.7*   < > 23.1* 17.3* 23.4* 24.0* 24.6*  MCV 87.0  --  86.8 86.5  --  87.9 88.8  PLT 44*  --  43* 31*  --  24* 41*   < > = values in this interval not displayed.   Cardiac Enzymes: No results for input(s): CKTOTAL, CKMB, CKMBINDEX, TROPONINI in the last 168 hours. BNP: BNP (last 3 results) No results for input(s): BNP in the last 8760 hours.  ProBNP (last 3 results) No results for input(s): PROBNP in the last 8760 hours.  CBG: No results for input(s): GLUCAP in the last 168 hours.     Signed:  Cristal Ford  [facility] 05/09/2020, 9:02 AM

## 2020-05-10 LAB — CULTURE, BLOOD (ROUTINE X 2)
Culture: NO GROWTH
Culture: NO GROWTH

## 2020-09-11 ENCOUNTER — Other Ambulatory Visit: Payer: Self-pay | Admitting: *Deleted

## 2020-09-11 DIAGNOSIS — C651 Malignant neoplasm of right renal pelvis: Secondary | ICD-10-CM

## 2020-09-11 DIAGNOSIS — C661 Malignant neoplasm of right ureter: Secondary | ICD-10-CM

## 2020-09-26 ENCOUNTER — Other Ambulatory Visit: Payer: 59

## 2020-10-03 ENCOUNTER — Inpatient Hospital Stay: Admission: RE | Admit: 2020-10-03 | Payer: 59 | Source: Ambulatory Visit

## 2020-10-25 ENCOUNTER — Ambulatory Visit
Admission: RE | Admit: 2020-10-25 | Discharge: 2020-10-25 | Disposition: A | Discharge status: Home / Self Care | Payer: Self-pay | Source: Ambulatory Visit | Attending: Radiation Oncology | Admitting: Radiation Oncology

## 2020-10-25 ENCOUNTER — Other Ambulatory Visit: Payer: Self-pay | Admitting: Radiation Oncology

## 2020-10-25 DIAGNOSIS — C661 Malignant neoplasm of right ureter: Secondary | ICD-10-CM

## 2020-10-25 DIAGNOSIS — C651 Malignant neoplasm of right renal pelvis: Secondary | ICD-10-CM

## 2020-10-25 NOTE — Progress Notes (Signed)
**  PATIENT TRANSFERRED CARE TO ANOTHER FACILITY**      GU Location of Tumor / Histology: right renal pelvis and ureter 12/19/2019   12/29/2019 HPI:  45 y.o. female seen in consultation at the request of Koirala, Dibas, MD for evaluation of renal mass. In February developed severe RUQ abdominal pain and presented to ED at outside hospital. At that time CT scan suggested pyelonephritis with possible right renal abscess. She was treated with bactrim. After completing antibiotic course, was symptom free for a couple weeks. She then developed hematuria (a single episode passing a blood clot), and represented to her PCP who ordered a repeat CT scan showing a growing abscess/mass in April. She was referred to Urology (Dr. Gloriann Loan) who started another 3-week course of bactrim. After starting the antibiotics, the pain returned. Biposy on 5/28 showed "coagulative necrosis, and surrounding parenchyma with hyalinized fibrosis, infiltrative tubular proliferation without cytological atypia and intact glomeruli". She never had fever, dysuria, or urgency. Continues to have daily achy pain 7/10; partially relieved by tramadol 250 PRN and ibuprofen 800 daily. No hematuria since the single episode. No nausea/vomiting/unintented weight loss. Developed hypertension in May with systolic pressures in 749S; started amlodipine 2.5 mg without appropriate control. Endorses one episode of heart palpitations. Developed new migraines roughly 1 year ago. Sweats through t-shirt nightly, starting about 5 years ago. She also endorses dry lips.    Past/Anticipated interventions by urology, if any: 01/25/2020 by Dr Thurmond Butts-  CYSTOURETHROSCOPY, W/URETEROSCOPY &/OR PYELOSCOPY; W/BIOPSY &/OR FULGURATION URETERAL/RENAL PELVIC LESION  Past/Anticipated interventions by medical oncology, if any: Tessie Eke, MD  02/16/2020 Plan - [place] of LN biopsy if tissue available - Place port - TTE - PET/CT - Plan for [facility] with plan for 6 cycles and  interval imaging with CT CAP after 3 - consider consolidative surgery with nephroureterectomy pending results of PET and response to chemotherapy (vs maintenance immunotherapy although this would only be considered in conjunction with hepatology and may be too high risk given baseline AI disease) - Will need Neulasta OnPro with each cycle - approved her insurance already - Need to confirm her plan for contraception on C1D1 - Refer back to hepatology - COVID vaccine #1 given today Therapist, music) - RTC 2 weeks to start C1D1 ddMVAC  Addendum: Repeat Ca on 8/11 was 13.0 and will plan 2L IV fluids, Zometa (given 8/12), and recheck in 2 days.   Weight changes, if any:   Bowel/Bladder complaints, if any:    Nausea/Vomiting, if any:   Pain issues, if any:    SAFETY ISSUES: Prior radiation?  Pacemaker/ICD?  Possible current pregnancy?  Is the patient on methotrexate?   Current Complaints / other details:

## 2020-10-26 ENCOUNTER — Other Ambulatory Visit: Payer: Self-pay | Admitting: Radiation Oncology

## 2020-10-26 ENCOUNTER — Ambulatory Visit
Admission: RE | Admit: 2020-10-26 | Discharge: 2020-10-26 | Disposition: A | Discharge status: Home / Self Care | Payer: Self-pay | Source: Ambulatory Visit | Attending: Radiation Oncology | Admitting: Radiation Oncology

## 2020-10-26 DIAGNOSIS — C651 Malignant neoplasm of right renal pelvis: Secondary | ICD-10-CM

## 2020-10-29 ENCOUNTER — Ambulatory Visit
Admission: RE | Admit: 2020-10-29 | Discharge: 2020-10-29 | Disposition: A | Discharge status: Home / Self Care | Payer: BC Managed Care – PPO | Source: Ambulatory Visit | Attending: Radiation Oncology | Admitting: Radiation Oncology

## 2020-10-29 ENCOUNTER — Ambulatory Visit: Payer: BC Managed Care – PPO

## 2020-10-29 NOTE — Progress Notes (Incomplete)
Radiation Oncology         [phone] ________________________________  Initial Outpatient Consultation  Name: Katrina Lewis MRN: [id number]  Date: 10/29/2020  DOB: 1976-01-19  DG:[id number], Dibas, MD  Lujean Amel, MD   REFERRING PHYSICIAN: Koirala, Dibas, MD  DIAGNOSIS: There were no encounter diagnoses.  Metastatic UTUC  HISTORY OF PRESENT ILLNESS::Katrina Lewis is a 45 y.o. female who is seen as a courtesy of Dr. Tessie Eke for an opinion concerning radiation therapy as part of management for her metastatic upper tract urothelial cancer. Today, she is accompanied by ***. The patient initially presented with right upper quadrant abdominal pain in February of 2021. CT scan of abdomen and pelvis on 08/19/2019 showed lobar nephronia/focal pyelonephritis involving the upper pole region of the right kidney with possible early abscess formation. There was also noted to be a possible mild inflammatory process involving the right hepatic lobe adjacent to the right kidney. MRI of the abdomen on that same day showed diffuse parenchymal edema/thickening and hypoenhancement throughout the upper 2/3 of the right kidney, most compatible with acute pyelonephritis. There was also noted to be an ill-defined 1.6 x 1.6 cm focus of phlegmonous change/early abscess formation in the posterior interpolar right kidney.  The patient was treated with a course of Bactrim, which temporarily resolved her pain. She then experienced an episode of hematuria and underwent a CT scan on 10/21/2019 that showed enlargement of the right kidney with mild fullness of the renal collecting system and mild edema in the peri-renal fat. Findings were consistent with acute pyelonephritis. However, without contrast, abscess may have been inapparent. She was referred to urology and was treated with an additional three-week course of Bactrim. Cystoscopy was negative at that time.  CT scan of abdomen and pelvis on 12/09/2019 that  showed a posterior right kidney lesion that measured 5.5 x 5.5 x 5.3 cm and was enlarged when compared to prior examination. The continued enlargement was very worrisome for renal cell carcinoma. There were also noted to be enlarged, hypodense right retroperitoneal and aortocaval lymph nodes that measured up to 1.7 x 1.7 cm and were enlarged in the interval, very worrisome for nodal metastatic disease. Finally, there was an approximately 7 mm nodule in the left long base that was non-specific.  The patient underwent a right kidney mass core needle biopsy on 12/19/2019 that showed ischemic necrosis as well as surrounding kidney parenchyma showing interstitial fibrosis and renal tubular proliferation.  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 The patient underwent a cystoscopy and ureteroscopy on the following day and was found to have papillary changes in the bladder and a blind ending proximal ureter with abnormal appearing ureter. Right proximal ureter biopsy was suspicious for invasive UC. Bladder biopsy showed only squamous mucosa.  The patient completed six cycles of ddMVAC from 02/23/2020 to 05/17/2020 with partial response, although she was found to have a new liver lesion after cycle 6. Repeat biopsy was bone for NGS and confirmed most likely urothelial origin. The patient was started on Avelumab on 06/18/2020 with immediate PD and then transitioned to Enfortumab Vedotin on 07/16/2020 with initial clinical response with improvement in pain. Unfortunately, CT scan of abdomen and pelvis on 09/01/2020 showed interval increase in size and number of hepatic lesions, interval increase in size of right infiltrative mass with worsening encasement of the right renal artery and invasion of the right  renal vein near the hilum, and interval increase in size of the  mesenteric lesion in the right lower quadrant. Right adrenal metastatic lesion was re-demonstrated. Given those findings, the patient was started on Sacituzumab on 09/10/2020.  The patient was last seen by Dr. Kalman Shan on 10/22/2020, at which time it was recommended that they re-direct goals from cancer-directed therapy to palliation alone. They discussed radiation therapy to the left posterior rib lesion followed by hospice care.  PREVIOUS RADIATION THERAPY: No  PAST MEDICAL HISTORY:  Past Medical History:  Diagnosis Date  . 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 Bacterial vaginosis   . Cancer ([place])   . CIN I (cervical intraepithelial neoplasia I)     PAST SURGICAL HISTORY: Past Surgical History:  Procedure Laterality Date  . BIOPSY  08/22/2019   Procedure: BIOPSY;  Surgeon: Ronald Lobo, MD;  Location: [facility];  Service: Endoscopy;;  . ESOPHAGOGASTRODUODENOSCOPY (EGD) WITH PROPOFOL N/A 08/22/2019   Procedure: ESOPHAGOGASTRODUODENOSCOPY (EGD) WITH PROPOFOL;  Surgeon: Ronald Lobo, MD;  Location: [place];  Service: Endoscopy;  Laterality: N/A;  . LABIOPLASTY Left 03/02/2013   Procedure: LABIAPLASTY;  Surgeon: Delice Lesch, MD;  Location: [facility];  Service: Gynecology;  Laterality: Left;  Wide Local Excision of Left Labia  . LEEP  04/16/2010  . LIVER BIOPSY  2007  . WISDOM TOOTH EXTRACTION      FAMILY HISTORY:  Family History  Problem Relation Age of Onset  . Diabetes Father   . Hypertension Father   . Hypertension Mother     SOCIAL HISTORY:  Social History   Tobacco Use  . Smoking status: Current Every Day Smoker    Packs/day: 1.00    Years: 20.00    Pack years: 20.00    Types: Cigarettes  . Smokeless tobacco: Never Used  Substance Use Topics  . Alcohol use: Yes    Comment: socially  . Drug use: No    ALLERGIES: No Known Allergies  MEDICATIONS:  Current Outpatient Medications  Medication Sig Dispense Refill  .  amphetamine-dextroamphetamine (ADDERALL) 20 MG tablet Take 10-20 mg by mouth See admin instructions. Take 20 mg by mouth in the morning and 10 mg in the afternoon    . aspirin (BAYER LOW DOSE) 81 MG EC tablet Take 81 mg by mouth in the morning. Swallow whole.    Marland Kitchen dexamethasone (DECADRON) 4 MG tablet Take 8 mg by mouth See admin instructions. "Take 8 mg (2 tablets) by mouth daily on days 2, 3, and 4"    . feeding supplement (ENSURE ENLIVE / ENSURE PLUS) LIQD Take 237 mLs by mouth 2 (two) times daily between meals.    . fluconazole (DIFLUCAN) 100 MG tablet Take 100 mg by mouth daily.    . Multiple Vitamin (MULTIVITAMIN WITH MINERALS) TABS tablet Take 1 tablet by mouth daily.    Marland Kitchen nystatin (MYCOSTATIN) 100000 UNIT/ML suspension Take 5 mLs by mouth See admin instructions. 5 ml's AS DIRECTED four times a day    . OLANZapine (ZYPREXA) 5 MG tablet Take 5 mg by mouth See admin instructions. "Take 5 mg by mouth at bedtime- start the day after chemotherapy and continue for three days"    . ondansetron (ZOFRAN) 8 MG tablet Take 8 mg by mouth every 8 (eight) hours as needed for nausea or vomiting.     . pantoprazole (PROTONIX) 40 MG tablet Take 1 tablet (40 mg total) by mouth daily. (Patient not taking: Reported on 05/05/2020) 90 tablet 0  .  prochlorperazine (COMPAZINE) 10 MG tablet Take 10 mg by mouth every 6 (six) hours as needed for nausea or vomiting.    . WELLBUTRIN XL 150 MG 24 hr tablet Take 150 mg by mouth daily.     No current facility-administered medications for this encounter.    REVIEW OF SYSTEMS:  A 10+ POINT REVIEW OF SYSTEMS WAS OBTAINED including neurology, dermatology, psychiatry, cardiac, respiratory, lymph, extremities, GI, GU, musculoskeletal, constitutional, reproductive, HEENT. ***   PHYSICAL EXAM:  vitals were not taken for this visit.   General: Alert and oriented, in no acute distress HEENT: Head is normocephalic. Extraocular movements are intact. Oropharynx is clear. Neck: Neck  is supple, no palpable cervical or supraclavicular lymphadenopathy. Heart: Regular in rate and rhythm with no murmurs, rubs, or gallops. Chest: Clear to auscultation bilaterally, with no rhonchi, wheezes, or rales. Abdomen: Soft, nontender, nondistended, with no rigidity or guarding. Extremities: No cyanosis or edema. Lymphatics: see Neck Exam Skin: No concerning lesions. Musculoskeletal: symmetric strength and muscle tone throughout. Neurologic: Cranial nerves II through XII are grossly intact. No obvious focalities. Speech is fluent. Coordination is intact. Psychiatric: Judgment and insight are intact. Affect is appropriate. ***  ECOG = ***  0 - Asymptomatic (Fully active, able to carry on all predisease activities without restriction)  1 - Symptomatic but completely ambulatory (Restricted in physically strenuous activity but ambulatory and able to carry out work of a light or sedentary nature. For example, light housework, office work)  2 - Symptomatic, <50% in bed during the day (Ambulatory and capable of all self care but unable to carry out any work activities. Up and about more than 50% of waking hours)  3 - Symptomatic, >50% in bed, but not bedbound (Capable of only limited self-care, confined to bed or chair 50% or more of waking hours)  4 - Bedbound (Completely disabled. Cannot carry on any self-care. Totally confined to bed or chair)  5 - Death   Eustace Pen MM, Creech RH, Tormey DC, et al. [phone]). "Toxicity and response criteria of the [facility]". [place] Oncol. 5 (6): 649-55  LABORATORY DATA:  Lab Results  Component Value Date   WBC 8.4 05/09/2020   HGB 8.2 (L) 05/09/2020   HCT 24.6 (L) 05/09/2020   MCV 88.8 05/09/2020   PLT 41 (L) 05/09/2020   NEUTROABS 3.9 05/09/2020   Lab Results  Component Value Date   NA 136 05/06/2020   K 3.5 05/06/2020   CL 104 05/06/2020   CO2 24 05/06/2020   GLUCOSE 79 05/06/2020   CREATININE 0.80 05/06/2020    CALCIUM 7.6 (L) 05/06/2020      RADIOGRAPHY: No results found.    IMPRESSION: Metastatic UTUC  ***  Today, I talked to the patient and family about the findings and work-up thus far.  We discussed the natural history of urothelial and general treatment, highlighting the role of radiotherapy in the management.  We discussed the available radiation techniques, and focused on the details of logistics and delivery.  We reviewed the anticipated acute and late sequelae associated with radiation in this setting.  The patient was encouraged to ask questions that I answered to the best of my ability. *** A patient consent form was discussed and signed.  We retained a copy for our records.  The patient would like to proceed with radiation and will be scheduled for CT simulation.  PLAN: ***   Total time spent in this encounter was *** minutes which included reviewing  the patient's CT scans, biopsies, pathology reports, consultations, follow-ups, treatments, physical examination, and documentation.  ------------------------------------------------  Blair Promise, PhD, MD  This document serves as a record of services personally performed by Gery Pray, MD. It was created on his behalf by Clerance Lav, a trained medical scribe. The creation of this record is based on the scribe's personal observations and the provider's statements to them. This document has been checked and approved by the attending provider.

## 2020-11-12 ENCOUNTER — Ambulatory Visit: Payer: BC Managed Care – PPO

## 2020-11-12 ENCOUNTER — Ambulatory Visit: Payer: BC Managed Care – PPO | Admitting: Radiation Oncology

## 2021-01-11 DEATH — deceased

## 2021-03-25 IMAGING — US US ABDOMEN LIMITED
1 series · 14 of 25 positions shown · non-contrast
Comparison: 01/10/2010

CLINICAL DATA: Right upper quadrant pain for 1 day, autoimmune
liver disease, elevated LFTs

EXAM:
ULTRASOUND ABDOMEN LIMITED RIGHT UPPER QUADRANT

[Series 1: us abdomen limited · 14 of 75 slices shown]
[im 1/75]
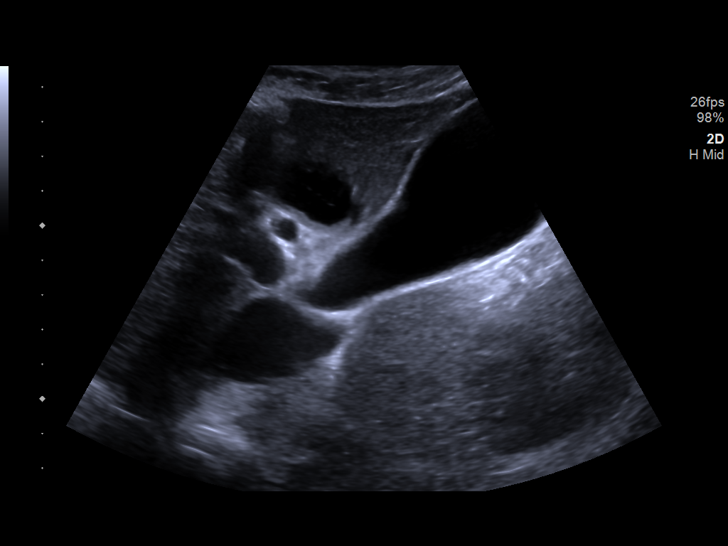
[im 7/75]
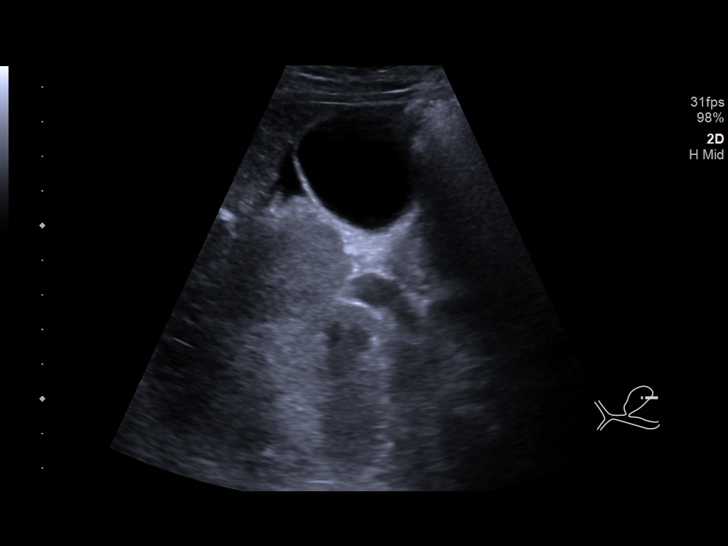
[im 13/75]
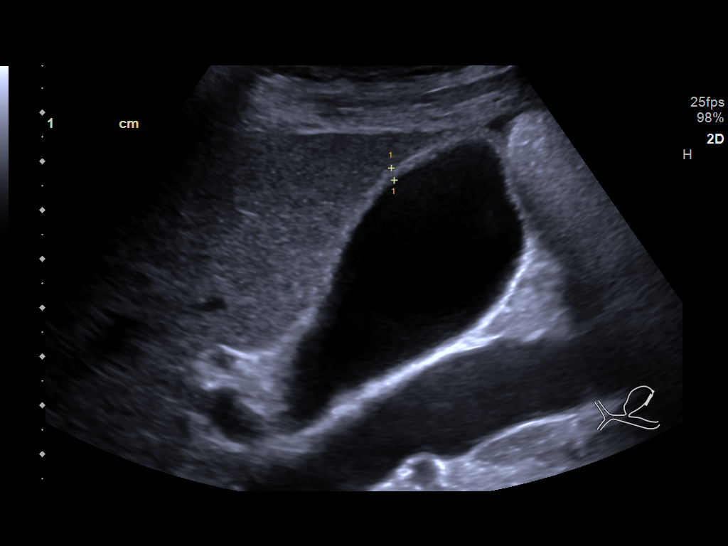
[im 19/75]
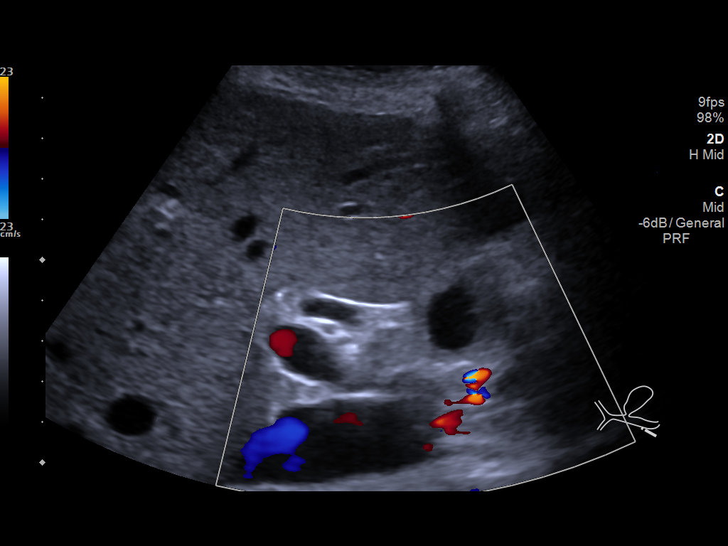
[im 25/75]
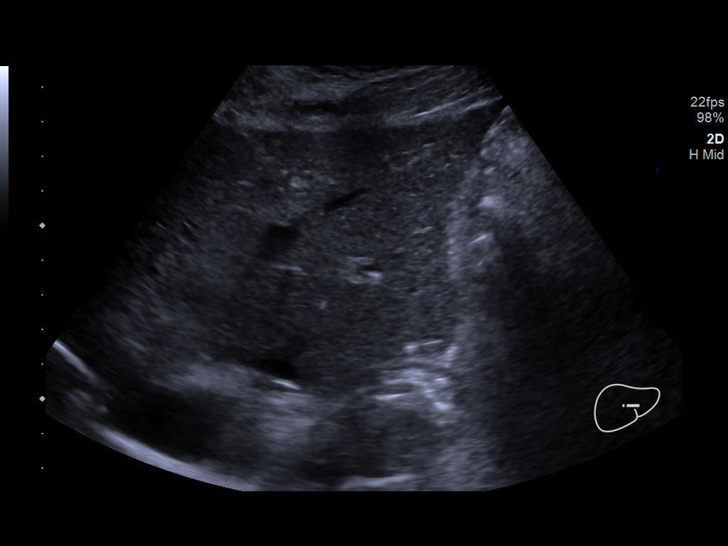
[im 28/75]
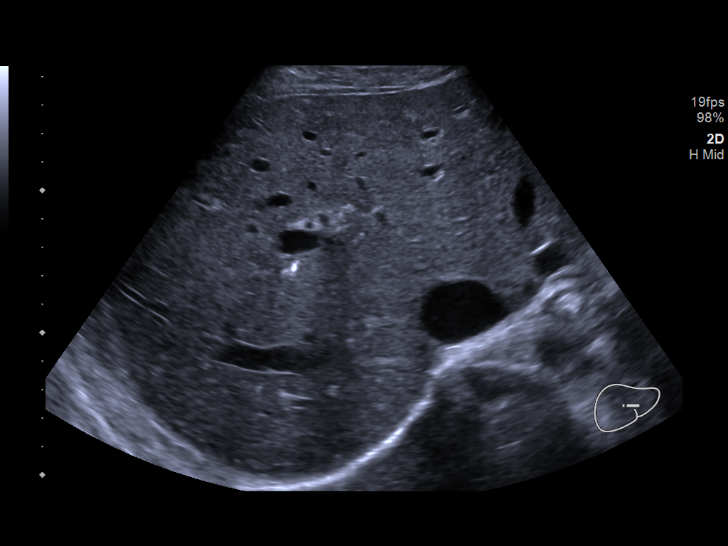
[im 34/75]
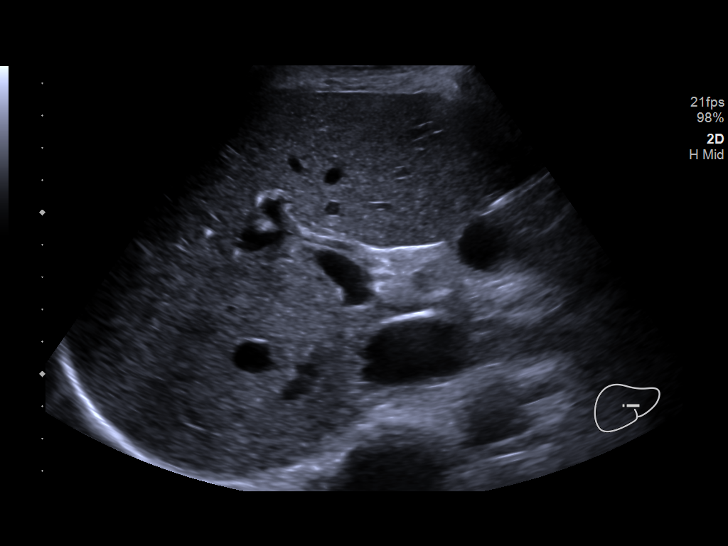
[im 41/75]
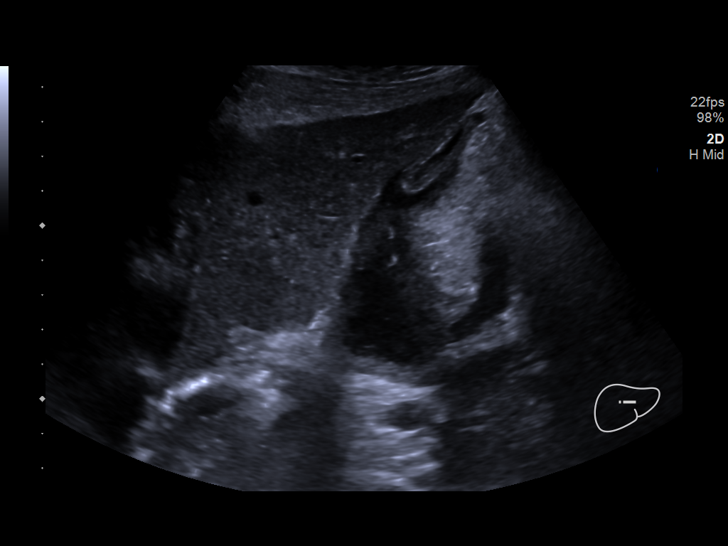
[im 47/75]
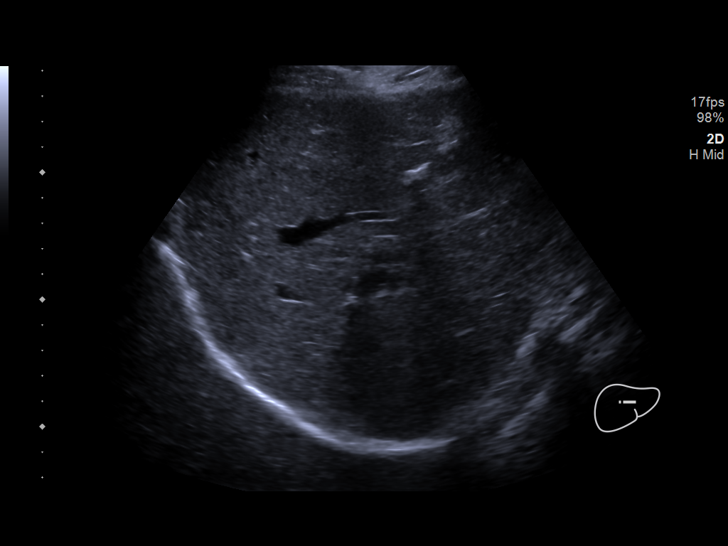
[im 50/75]
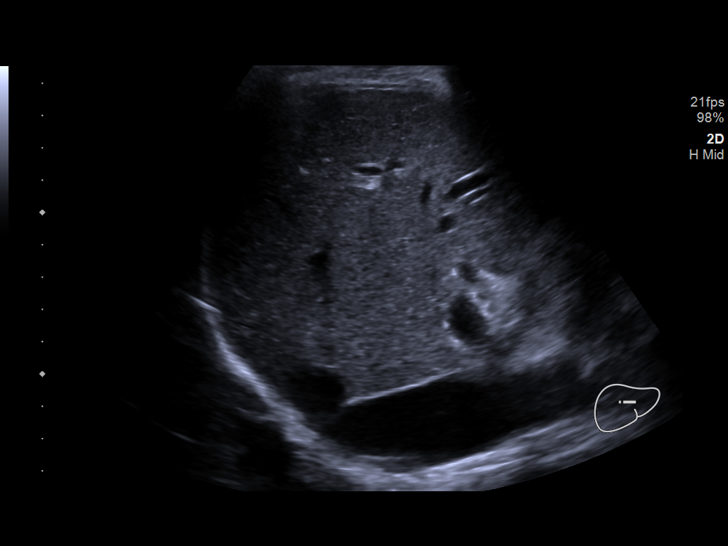
[im 56/75]
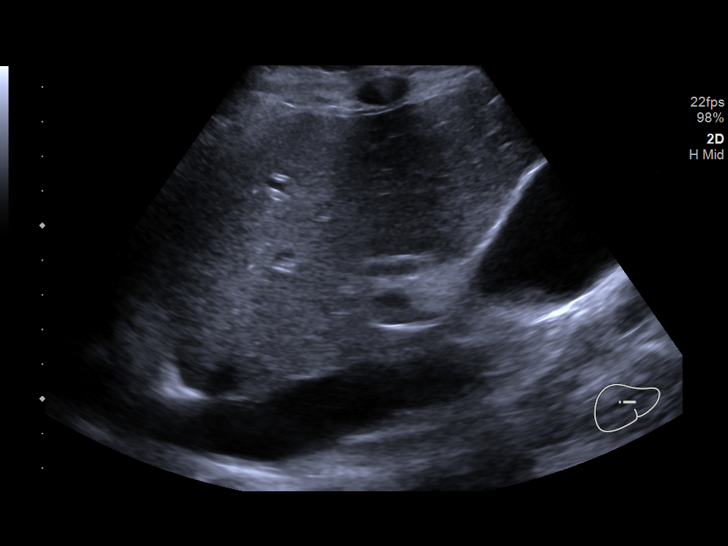
[im 62/75]
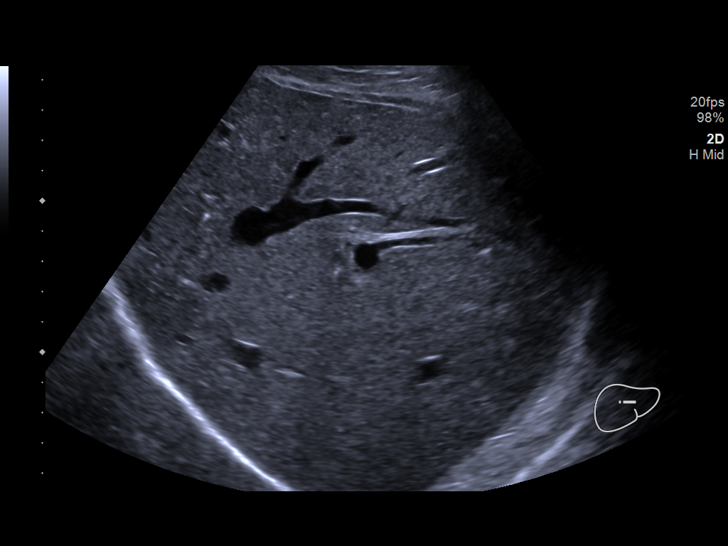
[im 68/75]
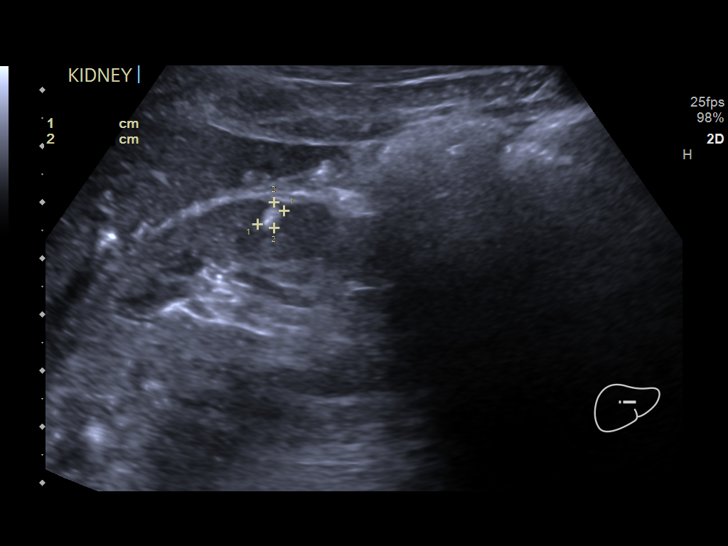
[im 75/75]
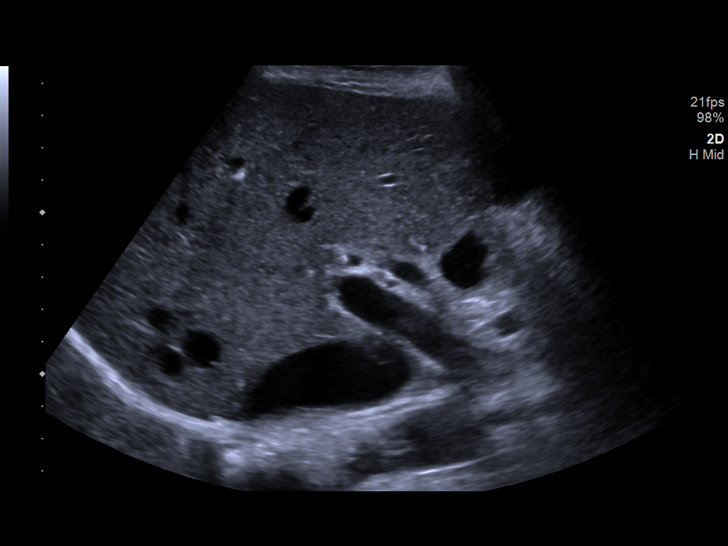

[14 of 25 positions shown; findings below may reference images not displayed]

FINDINGS: Gallbladder:

No gallstones or wall thickening visualized. No sonographic Murphy
sign noted by sonographer.

Common bile duct:

Diameter: 4 mm

Liver:

The liver demonstrates grossly normal echotexture. Shadowing
calcification right lobe liver measures up to 1.3 cm, benign
appearing. No focal liver lesion. Portal vein is patent on color
Doppler imaging with normal direction of blood flow towards the
liver.

Other: Incidental note is made of a 5 mm hyperechoic focus within
the anterior cortex of the right kidney, either a small cortical
calcification or small renal angiomyolipoma. This is of doubtful
clinical significance.
IMPRESSION: 1. Coarse benign-appearing calcification right lobe liver.
2. Small cortical calcification versus angiomyolipoma right kidney,
of doubtful clinical significance.
3. Otherwise unremarkable exam.

## 2021-03-26 IMAGING — MR MR ABDOMEN WO/W CM
11 of 18 series · 19 of 48 positions shown · IV contrast (8 MH)
Comparison: 08/19/2019 CT abdomen/pelvis.

CLINICAL DATA: Focal pyelonephritis suspected in the upper right
kidney on CT study from earlier today. Concern for early abscess.
Abdominal pain.

EXAM:
MRI ABDOMEN WITHOUT AND WITH CONTRAST
TECHNIQUE: Multiplanar multisequence MR imaging of the abdomen was performed
both before and after the administration of intravenous contrast.
CONTRAST:  7mL GADAVIST GADOBUTROL 1 MMOL/ML IV SOLN

[Series 2: cor ssfse nav · coronal · 6.0mm · 0.70mm/px · 1 of 28 slices shown]
[im 1/28]
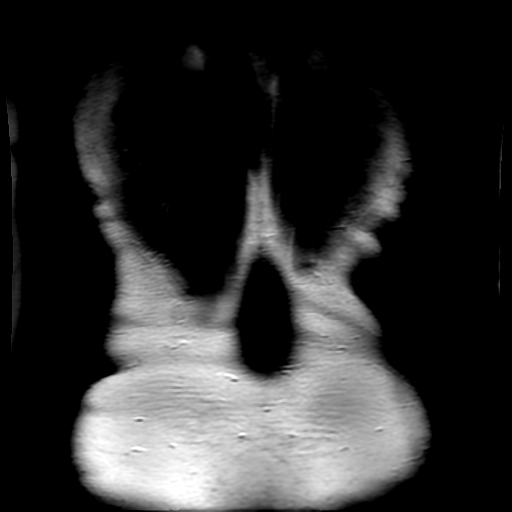

[Series 3: ax ssfse nav · axial · 6.0mm · 0.66mm/px · 1 of 51 slices shown]
[im 1/51]
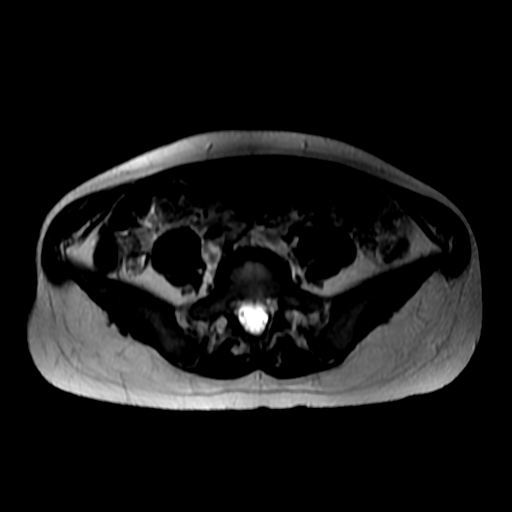

[Series 4: T2 fat-sat · axial · 6.0mm · 0.66mm/px · 1 of 51 slices shown]
[im 1/51]
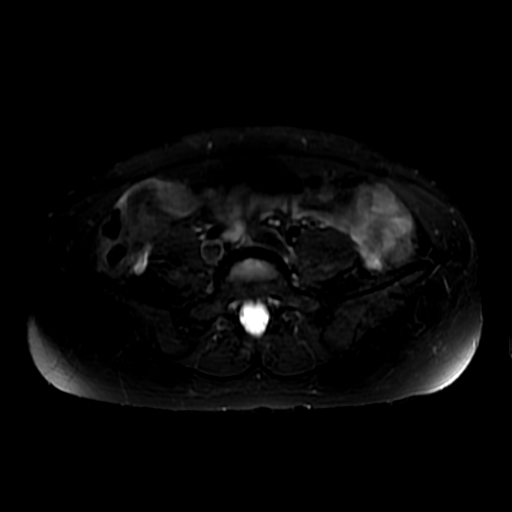

[Series 6: DWI b500 · axial · 8.0mm · 1.95mm/px · 1 of 64 slices shown]
[im 1/64]
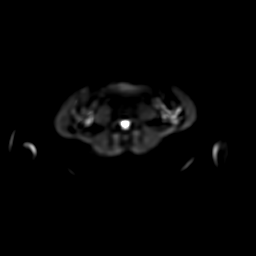

[Series 8: T1 dynamic post-contrast · coronal · 3.8mm · 0.70mm/px · 1 of 52 slices shown (1 of 3)]
[im 1/52]
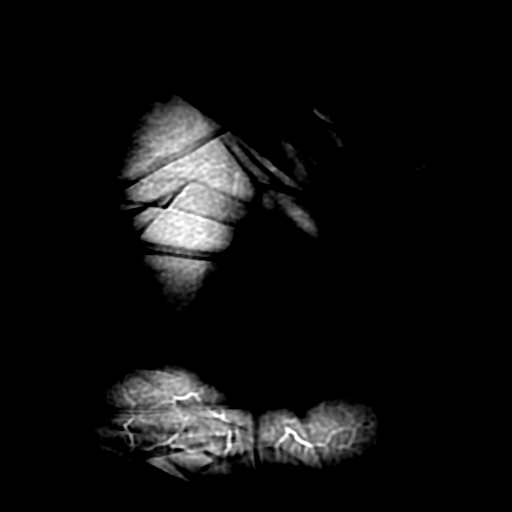

[Series 500: T1 dynamic · axial · 3.8mm · 0.74mm/px · z∈[-79,+175]mm · 2 of 68 slices shown (1 of 3)]
[im 1/68]
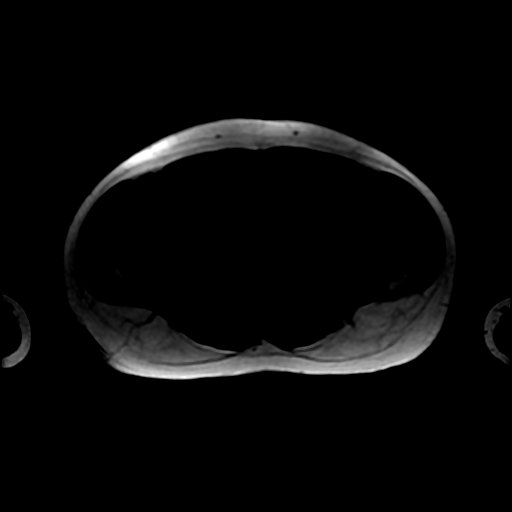
[im 68/68]
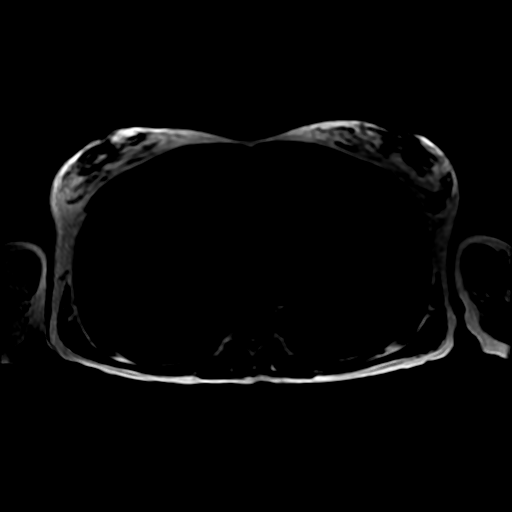

[Series 501: T1 dynamic · axial · 3.8mm · 0.74mm/px · z∈[-79,+175]mm · 2 of 68 slices shown (2 of 3)]
[im 1/68]
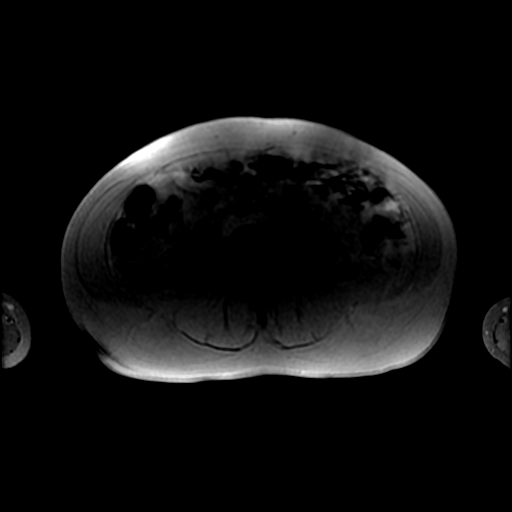
[im 68/68]
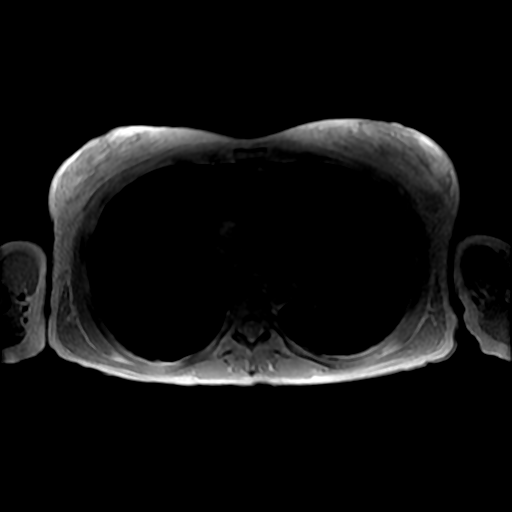

[Series 502: T1 dynamic · axial · 3.8mm · 0.74mm/px · z∈[-79,+175]mm · 2 of 68 slices shown (3 of 3)]
[im 1/68]
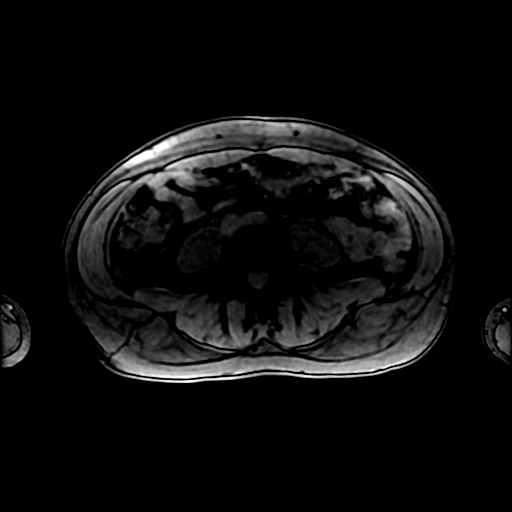
[im 68/68]
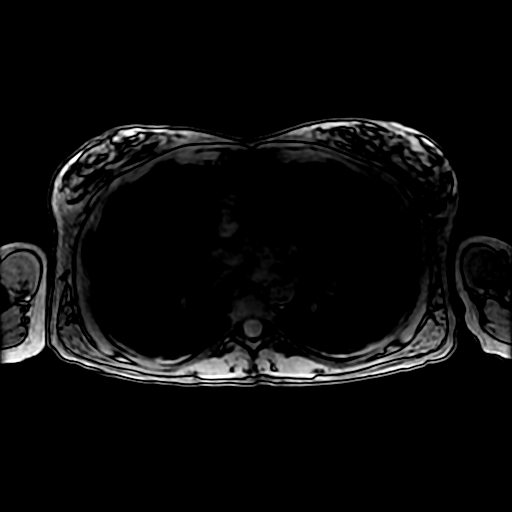

[Series 650: ADC · axial · 8.0mm · 1.95mm/px · 1 of 32 slices shown]
[im 1/32]
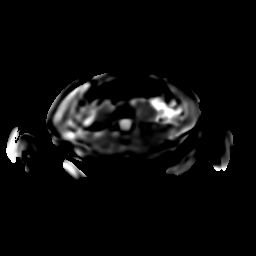

[Series 700: T1 dynamic post-contrast · axial · non-contrast · 4.0mm · 0.74mm/px · z∈[-94,+184]mm · 4 of 140 slices shown (2 of 3)]
[im 1/140]
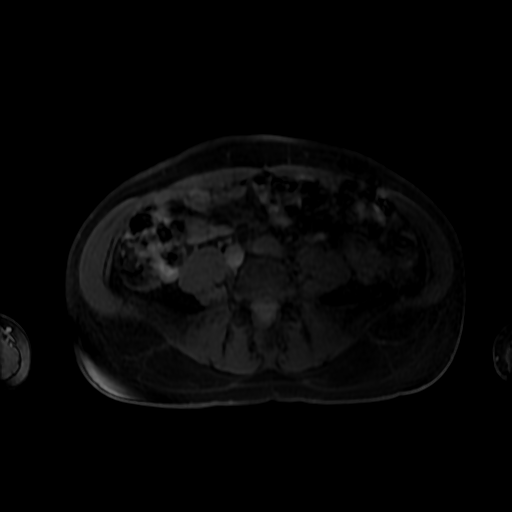
[im 47/140]
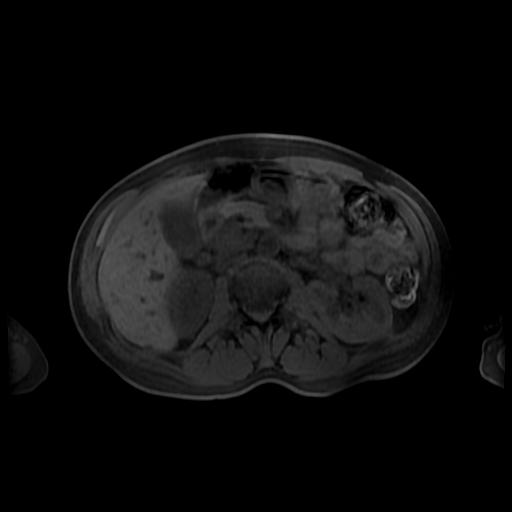
[im 93/140]
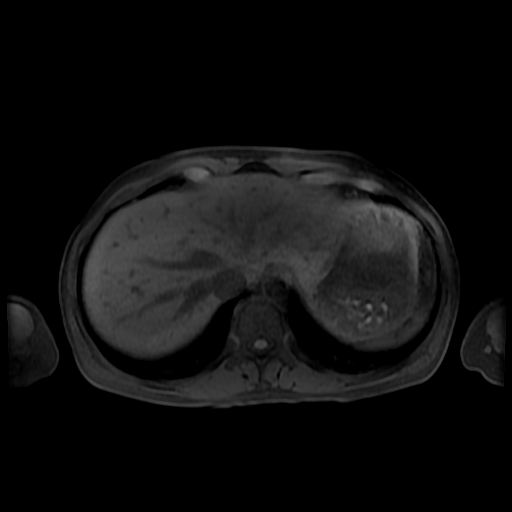
[im 140/140]
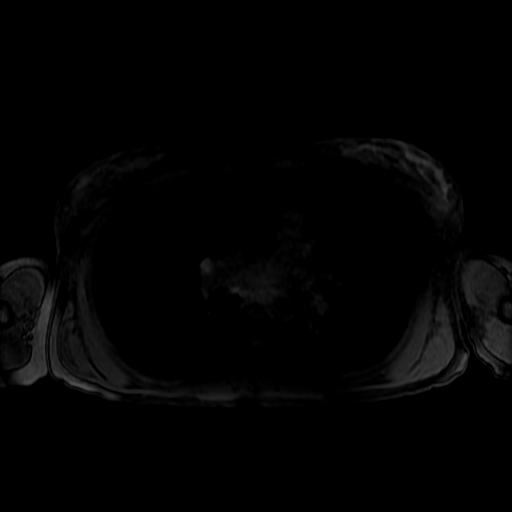

[Series 701: T1 dynamic post-contrast · axial · non-contrast · 4.0mm · 0.74mm/px · z∈[-94,+90]mm · 3 of 140 slices shown (3 of 3)]
[im 1/140]
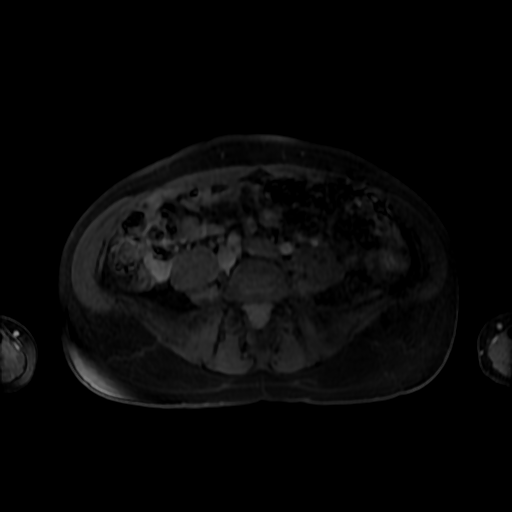
[im 47/140]
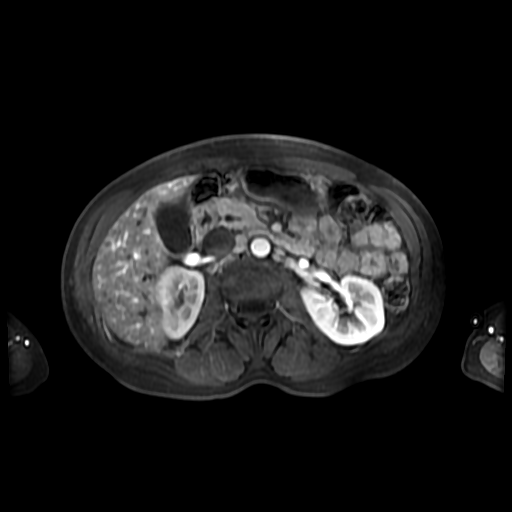
[im 93/140]
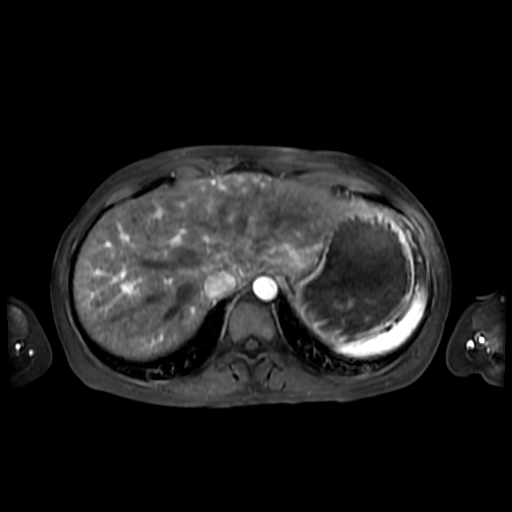

[19 of 48 positions shown; findings below may reference images not displayed]

FINDINGS: Lower chest: No acute abnormality at the lung bases.

Hepatobiliary: Normal liver size and configuration. No significant
hepatic steatosis. There is a 0.6 cm liver hemangioma in posterior
segment 6 right liver lobe (series 4/image 26) with mild T2
hyperintensity and progressive enhancement, stable since 01/28/2010
MRI. No additional liver lesions. Normal gallbladder with no
cholelithiasis. No biliary ductal dilatation. Common bile duct
diameter 5 mm. There is a questionable small 4 mm low signal
intensity filling defect in the lower third of the common bile duct
(series 2/image 20). No biliary strictures, enhancing masses or
beading. Generalized mild periportal edema.

Pancreas: No pancreatic mass or duct dilation.  No pancreas divisum.

Spleen: Normal size. No mass.

Adrenals/Urinary Tract: Normal adrenals. There is diffuse
parenchymal edema/thickening and heterogeneous hypoenhancement
throughout the upper [DATE] of the right renal parenchyma, most
compatible with acute pyelonephritis. There is a 2.2 x 1.4 cm cystic
focus in the central upper right kidney (series 704/image 90)
without restricted diffusion, suspected to represent upper pole
caliectasis. Remaining right renal collecting system is normal
caliber. There is a 1.6 x 1.6 cm ill-defined focus of phlegmonous
change/early abscess in the posterior interpolar right kidney
(series 704/image 109). Normal left kidney with no left renal
masses. No left hydronephrosis.

Stomach/Bowel: Normal non-distended stomach. Visualized small and
large bowel is normal caliber, with no bowel wall thickening.

Vascular/Lymphatic: Normal caliber abdominal aorta. Patent portal,
splenic, hepatic and renal veins. No pathologically enlarged lymph
nodes in the abdomen.

Other: No abdominal ascites or focal fluid collection.

Musculoskeletal: No aggressive appearing focal osseous lesions.
IMPRESSION: 1. Diffuse parenchymal edema/thickening and hypoenhancement
throughout the upper [DATE] of the right kidney, most compatible with
acute pyelonephritis.
2. Ill-defined 1.6 x 1.6 cm focus of phlegmonous change/early
abscess formation in the posterior interpolar right kidney.
3. Central upper right renal 2.2 x 1.4 cm cystic focus, suspected to
represent upper pole caliectasis.
4. Questionable 4 mm low signal intensity filling defect in the
lower third of the common bile duct, cannot exclude sludge or tiny
stone. No biliary ductal dilatation. CBD diameter 5 mm. Suggest
correlation with serum bilirubin levels.
5. Suggest short-term follow-up MRI abdomen with MRCP without and
with IV contrast after a course of antibiotic therapy.
